# Patient Record
Sex: Male | Born: 1995 | Hispanic: Yes | Marital: Single | Smoking: Never smoker
Health system: Southern US, Community
[De-identification: ages and names within clinical notes are randomized; demographics above are authoritative.]

## PROBLEM LIST (undated history)

## (undated) DIAGNOSIS — E739 Lactose intolerance, unspecified: Secondary | ICD-10-CM

## (undated) DIAGNOSIS — R143 Flatulence: Secondary | ICD-10-CM

## (undated) DIAGNOSIS — S62101A Fracture of unspecified carpal bone, right wrist, initial encounter for closed fracture: Secondary | ICD-10-CM

## (undated) HISTORY — PX: APPENDECTOMY: SHX54

## (undated) HISTORY — DX: Lactose intolerance, unspecified: E73.9

## (undated) HISTORY — PX: COLONOSCOPY: SHX174

## (undated) HISTORY — DX: Flatulence: R14.3

---
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
## 2013-07-10 ENCOUNTER — Emergency Department (HOSPITAL_COMMUNITY)
Admission: EM | Admit: 2013-07-10 | Discharge: 2013-07-10 | Disposition: A | Discharge status: Home / Self Care | Payer: Medicaid Other | Attending: Emergency Medicine | Admitting: Emergency Medicine

## 2013-07-10 ENCOUNTER — Encounter (HOSPITAL_COMMUNITY): Payer: Self-pay | Admitting: Emergency Medicine

## 2013-07-10 DIAGNOSIS — R1012 Left upper quadrant pain: Secondary | ICD-10-CM | POA: Insufficient documentation

## 2013-07-10 DIAGNOSIS — A088 Other specified intestinal infections: Secondary | ICD-10-CM | POA: Insufficient documentation

## 2013-07-10 DIAGNOSIS — Z8719 Personal history of other diseases of the digestive system: Secondary | ICD-10-CM | POA: Insufficient documentation

## 2013-07-10 DIAGNOSIS — R197 Diarrhea, unspecified: Secondary | ICD-10-CM | POA: Insufficient documentation

## 2013-07-10 DIAGNOSIS — R142 Eructation: Secondary | ICD-10-CM | POA: Insufficient documentation

## 2013-07-10 DIAGNOSIS — A084 Viral intestinal infection, unspecified: Secondary | ICD-10-CM

## 2013-07-10 DIAGNOSIS — R141 Gas pain: Secondary | ICD-10-CM | POA: Insufficient documentation

## 2013-07-10 MED ORDER — ONDANSETRON 8 MG PO TBDP
8.0000 mg | ORAL_TABLET | Freq: Once | ORAL | Status: AC
  Administered 2013-07-10: 8 mg via ORAL
  Filled 2013-07-10: qty 1

## 2013-07-10 MED ORDER — ONDANSETRON HCL 8 MG PO TABS: 8.0000 mg | ORAL_TABLET | Freq: Three times a day (TID) | ORAL | Status: DC | PRN

## 2013-07-10 MED ORDER — ACETAMINOPHEN 325 MG PO TABS
650.0000 mg | ORAL_TABLET | Freq: Once | ORAL | Status: AC
  Administered 2013-07-10: 650 mg via ORAL
  Filled 2013-07-10: qty 2

## 2013-07-10 NOTE — ED Notes (Signed)
Pt tolerated ginger ale well---- able to drink 2 cans; denies abdominal pain and nausea at this time; pt states, "pain is gone".  Has had watery stool, was advised to drink plenty of fluids and eat bananas.

## 2013-07-10 NOTE — ED Notes (Signed)
Pt was given ginger ale per his preference.

## 2013-07-10 NOTE — ED Provider Notes (Signed)
Medical screening examination/treatment/procedure(s) were performed by non-physician practitioner and as supervising physician I was immediately available for consultation/collaboration.    Olivia Mackie, MD 07/10/13 [phone]

## 2013-07-10 NOTE — ED Provider Notes (Signed)
CSN: [id number]     Arrival date & time 07/10/13  0301 History   First MD Initiated Contact with Patient 07/10/13 0310     Chief Complaint  Patient presents with  . Emesis  . Diarrhea  . Abdominal Pain   (Consider location/radiation/quality/duration/timing/severity/associated sxs/prior Treatment) HPI History provided by pt.   Pt presents w/ N/V/D since yesterday am.  Associated w/ diffuse abdominal cramping and gurgling that is temporarily alleviated by having a BM, as well as belching that smells of feces.  Denies fever, hematemesis/hematochezia/melena and urinary sx.  No known sick contacts.  No PMH.   Past Medical History  Diagnosis Date  . Flatulence   . Lactose malabsorption   . Wrist fracture, right    Past Surgical History  Procedure Laterality Date  . Appendectomy    . Colonoscopy     Family History  Problem Relation Age of Onset  . Diabetes Other    History  Substance Use Topics  . Smoking status: Never Smoker   . Smokeless tobacco: Not on file  . Alcohol Use: No    Review of Systems  All other systems reviewed and are negative.    Allergies  Review of patient's allergies indicates no known allergies.  Home Medications  No current outpatient prescriptions on file. BP 121/61  Pulse 64  Temp(Src) 98.4 F (36.9 C) (Oral)  Resp 19  Ht 5\' 7"  (1.702 m)  Wt 134 lb 9.6 oz (61.054 kg)  BMI 21.08 kg/m2  SpO2 100% Physical Exam  Nursing note and vitals reviewed. Constitutional: He is oriented to person, place, and time. He appears well-developed and well-nourished. No distress.  HENT:  Head: Normocephalic and atraumatic.  Mouth/Throat: Oropharynx is clear and moist.  Eyes:  Normal appearance  Neck: Normal range of motion.  Cardiovascular: Normal rate and regular rhythm.   HR 60  Pulmonary/Chest: Effort normal and breath sounds normal. No respiratory distress.  Abdominal: Soft. Bowel sounds are normal. He exhibits no distension and no mass. There is no  rebound and no guarding.  Mild tenderness LUQ only  Genitourinary:  No CVA tenderness  Musculoskeletal: Normal range of motion.  Neurological: He is alert and oriented to person, place, and time.  Skin: Skin is warm and dry. No rash noted.  Psychiatric: He has a normal mood and affect. His behavior is normal.    ED Course  Procedures (including critical care time) Labs Review Labs Reviewed - No data to display Imaging Review No results found.  EKG Interpretation   None       MDM   1. Viral gastroenteritis    17yo healthy M presents w/ N/V/D and diffuse abd pain.  On exam, afebrile, well-hydrated, abd benign.  Suspect viral gastroenteritis.  Will try oral zofran and tylenol and then po challenge.  3:46 AM   Pain and nausea resolved.  Tolerating pos.  D/c'd home w/ zofran and recommended imodium prn and oral hydration.  Return precautions discussed. 4:43 AM     Otilio Miu, PA-C 07/10/13 [phone]

## 2013-07-10 NOTE — ED Notes (Signed)
Pt states he started having abd pain on Tuesday morning that was followed by diarrhea  Pt states he has had a lot of gas and when he burps it smells of feces  Pt states vomiting started this evening  Last vomited about 30 minutes ago

## 2014-10-20 ENCOUNTER — Encounter (HOSPITAL_COMMUNITY): Payer: Self-pay

## 2014-10-20 ENCOUNTER — Emergency Department (HOSPITAL_COMMUNITY)
Admission: EM | Admit: 2014-10-20 | Discharge: 2014-10-20 | Disposition: A | Discharge status: Home / Self Care | Payer: Medicaid Other | Attending: Emergency Medicine | Admitting: Emergency Medicine

## 2014-10-20 DIAGNOSIS — Z8781 Personal history of (healed) traumatic fracture: Secondary | ICD-10-CM | POA: Insufficient documentation

## 2014-10-20 DIAGNOSIS — R51 Headache: Secondary | ICD-10-CM | POA: Insufficient documentation

## 2014-10-20 DIAGNOSIS — R519 Headache, unspecified: Secondary | ICD-10-CM

## 2014-10-20 DIAGNOSIS — Z8639 Personal history of other endocrine, nutritional and metabolic disease: Secondary | ICD-10-CM | POA: Insufficient documentation

## 2014-10-20 DIAGNOSIS — H5712 Ocular pain, left eye: Secondary | ICD-10-CM | POA: Insufficient documentation

## 2014-10-20 MED ORDER — METOCLOPRAMIDE HCL 10 MG PO TABS: 10.0000 mg | ORAL_TABLET | Freq: Four times a day (QID) | ORAL | Status: DC

## 2014-10-20 MED ORDER — METOCLOPRAMIDE HCL 5 MG/ML IJ SOLN
10.0000 mg | Freq: Once | INTRAMUSCULAR | Status: AC
  Administered 2014-10-20: 10 mg via INTRAMUSCULAR
  Filled 2014-10-20: qty 2

## 2014-10-20 MED ORDER — KETOROLAC TROMETHAMINE 60 MG/2ML IM SOLN
60.0000 mg | Freq: Once | INTRAMUSCULAR | Status: AC
  Administered 2014-10-20: 60 mg via INTRAMUSCULAR
  Filled 2014-10-20: qty 2

## 2014-10-20 MED ORDER — NAPROXEN 500 MG PO TABS: 500.0000 mg | ORAL_TABLET | Freq: Two times a day (BID) | ORAL | Status: DC

## 2014-10-20 NOTE — Discharge Instructions (Signed)
Headaches, Frequently Asked Questions °MIGRAINE HEADACHES °Q: What is migraine? What causes it? How can I treat it? °A: Generally, migraine headaches begin as a dull ache. Then they develop into a constant, throbbing, and pulsating pain. You may experience pain at the temples. You may experience pain at the front or back of one or both sides of the head. The pain is usually accompanied by a combination of: °· Nausea. °· Vomiting. °· Sensitivity to light and noise. °Some people (about 15%) experience an aura (see below) before an attack. The cause of migraine is believed to be chemical reactions in the brain. Treatment for migraine may include over-the-counter or prescription medications. It may also include self-help techniques. These include relaxation training and biofeedback.  °Q: What is an aura? °A: About 15% of people with migraine get an "aura". This is a sign of neurological symptoms that occur before a migraine headache. You may see wavy or jagged lines, dots, or flashing lights. You might experience tunnel vision or blind spots in one or both eyes. The aura can include visual or auditory hallucinations (something imagined). It may include disruptions in smell (such as strange odors), taste or touch. Other symptoms include: °· Numbness. °· A "pins and needles" sensation. °· Difficulty in recalling or speaking the correct word. °These neurological events may last as long as 60 minutes. These symptoms will fade as the headache begins. °Q: What is a trigger? °A: Certain physical or environmental factors can lead to or "trigger" a migraine. These include: °· Foods. °· Hormonal changes. °· Weather. °· Stress. °It is important to remember that triggers are different for everyone. To help prevent migraine attacks, you need to figure out which triggers affect you. Keep a headache diary. This is a good way to track triggers. The diary will help you talk to your healthcare professional about your condition. °Q: Does  weather affect migraines? °A: Bright sunshine, hot, humid conditions, and drastic changes in barometric pressure may lead to, or "trigger," a migraine attack in some people. But studies have shown that weather does not act as a trigger for everyone with migraines. °Q: What is the link between migraine and hormones? °A: Hormones start and regulate many of your body's functions. Hormones keep your body in balance within a constantly changing environment. The levels of hormones in your body are unbalanced at times. Examples are during menstruation, pregnancy, or menopause. That can lead to a migraine attack. In fact, about three quarters of all women with migraine report that their attacks are related to the menstrual cycle.  °Q: Is there an increased risk of stroke for migraine sufferers? °A: The likelihood of a migraine attack causing a stroke is very remote. That is not to say that migraine sufferers cannot have a stroke associated with their migraines. In persons under age 40, the most common associated factor for stroke is migraine headache. But over the course of a person's normal life span, the occurrence of migraine headache may actually be associated with a reduced risk of dying from cerebrovascular disease due to stroke.  °Q: What are acute medications for migraine? °A: Acute medications are used to treat the pain of the headache after it has started. Examples over-the-counter medications, NSAIDs, ergots, and triptans.  °Q: What are the triptans? °A: Triptans are the newest class of abortive medications. They are specifically targeted to treat migraine. Triptans are vasoconstrictors. They moderate some chemical reactions in the brain. The triptans work on receptors in your brain. Triptans help   to restore the balance of a neurotransmitter called serotonin. Fluctuations in levels of serotonin are thought to be a main cause of migraine.  °Q: Are over-the-counter medications for migraine effective? °A:  Over-the-counter, or "OTC," medications may be effective in relieving mild to moderate pain and associated symptoms of migraine. But you should see your caregiver before beginning any treatment regimen for migraine.  °Q: What are preventive medications for migraine? °A: Preventive medications for migraine are sometimes referred to as "prophylactic" treatments. They are used to reduce the frequency, severity, and length of migraine attacks. Examples of preventive medications include antiepileptic medications, antidepressants, beta-blockers, calcium channel blockers, and NSAIDs (nonsteroidal anti-inflammatory drugs). °Q: Why are anticonvulsants used to treat migraine? °A: During the past few years, there has been an increased interest in antiepileptic drugs for the prevention of migraine. They are sometimes referred to as "anticonvulsants". Both epilepsy and migraine may be caused by similar reactions in the brain.  °Q: Why are antidepressants used to treat migraine? °A: Antidepressants are typically used to treat people with depression. They may reduce migraine frequency by regulating chemical levels, such as serotonin, in the brain.  °Q: What alternative therapies are used to treat migraine? °A: The term "alternative therapies" is often used to describe treatments considered outside the scope of conventional Western medicine. Examples of alternative therapy include acupuncture, acupressure, and yoga. Another common alternative treatment is herbal therapy. Some herbs are believed to relieve headache pain. Always discuss alternative therapies with your caregiver before proceeding. Some herbal products contain arsenic and other toxins. °TENSION HEADACHES °Q: What is a tension-type headache? What causes it? How can I treat it? °A: Tension-type headaches occur randomly. They are often the result of temporary stress, anxiety, fatigue, or anger. Symptoms include soreness in your temples, a tightening band-like sensation  around your head (a "vice-like" ache). Symptoms can also include a pulling feeling, pressure sensations, and contracting head and neck muscles. The headache begins in your forehead, temples, or the back of your head and neck. Treatment for tension-type headache may include over-the-counter or prescription medications. Treatment may also include self-help techniques such as relaxation training and biofeedback. °CLUSTER HEADACHES °Q: What is a cluster headache? What causes it? How can I treat it? °A: Cluster headache gets its name because the attacks come in groups. The pain arrives with little, if any, warning. It is usually on one side of the head. A tearing or bloodshot eye and a runny nose on the same side of the headache may also accompany the pain. Cluster headaches are believed to be caused by chemical reactions in the brain. They have been described as the most severe and intense of any headache type. Treatment for cluster headache includes prescription medication and oxygen. °SINUS HEADACHES °Q: What is a sinus headache? What causes it? How can I treat it? °A: When a cavity in the bones of the face and skull (a sinus) becomes inflamed, the inflammation will cause localized pain. This condition is usually the result of an allergic reaction, a tumor, or an infection. If your headache is caused by a sinus blockage, such as an infection, you will probably have a fever. An x-ray will confirm a sinus blockage. Your caregiver's treatment might include antibiotics for the infection, as well as antihistamines or decongestants.  °REBOUND HEADACHES °Q: What is a rebound headache? What causes it? How can I treat it? °A: A pattern of taking acute headache medications too often can lead to a condition known as "rebound headache."   A pattern of taking too much headache medication includes taking it more than 2 days per week or in excessive amounts. That means more than the label or a caregiver advises. With rebound  headaches, your medications not only stop relieving pain, they actually begin to cause headaches. Doctors treat rebound headache by tapering the medication that is being overused. Sometimes your caregiver will gradually substitute a different type of treatment or medication. Stopping may be a challenge. Regularly overusing a medication increases the potential for serious side effects. Consult a caregiver if you regularly use headache medications more than 2 days per week or more than the label advises. °ADDITIONAL QUESTIONS AND ANSWERS °Q: What is biofeedback? °A: Biofeedback is a self-help treatment. Biofeedback uses special equipment to monitor your body's involuntary physical responses. Biofeedback monitors: °· Breathing. °· Pulse. °· Heart rate. °· Temperature. °· Muscle tension. °· Brain activity. °Biofeedback helps you refine and perfect your relaxation exercises. You learn to control the physical responses that are related to stress. Once the technique has been mastered, you do not need the equipment any more. °Q: Are headaches hereditary? °A: Four out of five (80%) of people that suffer report a family history of migraine. Scientists are not sure if this is genetic or a family predisposition. Despite the uncertainty, a child has a 50% chance of having migraine if one parent suffers. The child has a 75% chance if both parents suffer.  °Q: Can children get headaches? °A: By the time they reach high school, most young people have experienced some type of headache. Many safe and effective approaches or medications can prevent a headache from occurring or stop it after it has begun.  °Q: What type of doctor should I see to diagnose and treat my headache? °A: Start with your primary caregiver. Discuss his or her experience and approach to headaches. Discuss methods of classification, diagnosis, and treatment. Your caregiver may decide to recommend you to a headache specialist, depending upon your symptoms or other  physical conditions. Having diabetes, allergies, etc., may require a more comprehensive and inclusive approach to your headache. The [employer] will provide, upon request, a list of NHF physician members in your state. °Document Released: 10/01/2003 Document Revised: 10/03/2011 Document Reviewed: 03/10/2008 °ExitCare® Patient Information ©2015 [employer]. This information is not intended to replace advice given to you by your health care provider. Make sure you discuss any questions you have with your health care provider. ° °Migraine Headache °A migraine headache is an intense, throbbing pain on one or both sides of your head. A migraine can last for 30 minutes to several hours. °CAUSES  °The exact cause of a migraine headache is not always known. However, a migraine may be caused when nerves in the brain become irritated and release chemicals that cause inflammation. This causes pain. °Certain things may also trigger migraines, such as: °· Alcohol. °· Smoking. °· Stress. °· Menstruation. °· Aged cheeses. °· Foods or drinks that contain nitrates, glutamate, aspartame, or tyramine. °· Lack of sleep. °· Chocolate. °· Caffeine. °· Hunger. °· Physical exertion. °· Fatigue. °· Medicines used to treat chest pain (nitroglycerine), birth control pills, estrogen, and some blood pressure medicines. °SIGNS AND SYMPTOMS °· Pain on one or both sides of your head. °· Pulsating or throbbing pain. °· Severe pain that prevents daily activities. °· Pain that is aggravated by any physical activity. °· Nausea, vomiting, or both. °· Dizziness. °· Pain with exposure to bright lights, loud noises, or activity. °·   General sensitivity to bright lights, loud noises, or smells. °Before you get a migraine, you may get warning signs that a migraine is coming (aura). An aura may include: °· Seeing flashing lights. °· Seeing bright spots, halos, or zigzag lines. °· Having tunnel vision or blurred vision. °· Having feelings  of numbness or tingling. °· Having trouble talking. °· Having muscle weakness. °DIAGNOSIS  °A migraine headache is often diagnosed based on: °· Symptoms. °· Physical exam. °· A CT scan or MRI of your head. These imaging tests cannot diagnose migraines, but they can help rule out other causes of headaches. °TREATMENT °Medicines may be given for pain and nausea. Medicines can also be given to help prevent recurrent migraines.  °HOME CARE INSTRUCTIONS °· Only take over-the-counter or prescription medicines for pain or discomfort as directed by your health care provider. The use of long-term narcotics is not recommended. °· Lie down in a dark, quiet room when you have a migraine. °· Keep a journal to find out what may trigger your migraine headaches. For example, write down: °¨ What you eat and drink. °¨ How much sleep you get. °¨ Any change to your diet or medicines. °· Limit alcohol consumption. °· Quit smoking if you smoke. °· Get 7-9 hours of sleep, or as recommended by your health care provider. °· Limit stress. °· Keep lights dim if bright lights bother you and make your migraines worse. °SEEK IMMEDIATE MEDICAL CARE IF:  °· Your migraine becomes severe. °· You have a fever. °· You have a stiff neck. °· You have vision loss. °· You have muscular weakness or loss of muscle control. °· You start losing your balance or have trouble walking. °· You feel faint or pass out. °· You have severe symptoms that are different from your first symptoms. °MAKE SURE YOU:  °· Understand these instructions. °· Will watch your condition. °· Will get help right away if you are not doing well or get worse. °Document Released: 07/11/2005 Document Revised: 11/25/2013 Document Reviewed: 03/18/2013 °ExitCare® Patient Information ©2015 [employer]. This information is not intended to replace advice given to you by your health care provider. Make sure you discuss any questions you have with your health care provider. ° °

## 2014-10-20 NOTE — ED Notes (Addendum)
Pt states he has had left eye pain and headache x 4 days. Denies injury. No redness or exudate noted. Denies n/v. Pt has mild photophobia.

## 2014-10-20 NOTE — ED Provider Notes (Signed)
CSN: [id number]     Arrival date & time 10/20/14  1329 History  This chart was scribed for non-physician practitioner, Joycie Peek, PA-C, working with Zadie Rhine, MD by Charline Bills, ED Scribe. This patient was seen in room TR04C/TR04C and the patient's care was started at 2:52 PM.   Chief Complaint  Patient presents with  . Headache  . Eye Pain   The history is provided by the patient. No language interpreter was used.   HPI Comments: Kevin Anderson is a 19 y.o. male who presents to the Emergency Department complaining of gradual onset, persistent L-sided HA for the past 4 days. Pt describes HA as 7/10, pulsating sensation and pressure. He reports associated intermittent throbbing L eye pain for the past 4 days. Pain is improved with leaning forward and exacerbated with standing and swift movement. He reports h/o similar HA . Denies numbness, weakness, nausea, vomiting, difficulty walking, neck stiffness, fevers, rash. Pt has been treating with ibuprofen some relief. No other alleviating or aggravating factors.   Past Medical History  Diagnosis Date  . Flatulence   . Lactose malabsorption   . Wrist fracture, right    Past Surgical History  Procedure Laterality Date  . Appendectomy    . Colonoscopy     Family History  Problem Relation Age of Onset  . Diabetes Other    History  Substance Use Topics  . Smoking status: Never Smoker   . Smokeless tobacco: Not on file  . Alcohol Use: No    Review of Systems  Eyes: Positive for pain.  Gastrointestinal: Negative for nausea and vomiting.  Musculoskeletal: Negative for gait problem.  Neurological: Positive for headaches. Negative for weakness and numbness.   Allergies  Review of patient's allergies indicates no known allergies.  Home Medications   Prior to Admission medications   Medication Sig Start Date End Date Taking? Authorizing Provider  metoCLOPramide (REGLAN) 10 MG tablet Take 1 tablet (10 mg total) by  mouth every 6 (six) hours. 10/20/14   Joycie Peek, PA-C  naproxen (NAPROSYN) 500 MG tablet Take 1 tablet (500 mg total) by mouth 2 (two) times daily. 10/20/14   Joycie Peek, PA-C  ondansetron (ZOFRAN) 8 MG tablet Take 1 tablet (8 mg total) by mouth every 8 (eight) hours as needed for nausea or vomiting. 07/10/13   Ruby Cola, PA-C   BP 141/75 mmHg  Pulse 74  Temp(Src) 98 F (36.7 C) (Oral)  Resp 17  Ht  (1.702 m)  Wt 141 lb (63.957 kg)  BMI 22.08 kg/m2  SpO2 100% Physical Exam  Constitutional: He is oriented to person, place, and time. He appears well-developed and well-nourished. No distress.  HENT:  Head: Normocephalic and atraumatic.  Eyes: Conjunctivae and EOM are normal. Pupils are equal, round, and reactive to light.  Extraocular movements intact without discomfort or nystagmus.  Neck: Neck supple. No tracheal deviation present.  No meningismus or nuchal rigidity  Cardiovascular: Normal rate, regular rhythm and normal heart sounds.   Pulmonary/Chest: Effort normal. No respiratory distress.  Lungs are clear to auscultation.   Abdominal: Soft. There is no tenderness.  Musculoskeletal: Normal range of motion.  Moves all extremities without ataxia.  Neurological: He is alert and oriented to person, place, and time. He has normal strength. Gait normal.  Motor and strength is 5/5. Gait is normal.  Skin: Skin is warm and dry.  Psychiatric: He has a normal mood and affect. His behavior is normal.  Nursing note and vitals reviewed.  ED Course  Procedures (including critical care time) DIAGNOSTIC STUDIES: Oxygen Saturation is 100% on RA, normal by my interpretation.    COORDINATION OF CARE: 2:59 PM-Discussed treatment plan which includes Toradol and Reglan with pt at bedside and pt agreed to plan.   Labs Review Labs Reviewed - No data to display  Imaging Review No results found.   EKG Interpretation None     Meds given in ED:  Medications   ketorolac (TORADOL) injection 60 mg (60 mg Intramuscular Given 10/20/14 1507)  metoCLOPramide (REGLAN) injection 10 mg (10 mg Intramuscular Given 10/20/14 1506)    New Prescriptions   METOCLOPRAMIDE (REGLAN) 10 MG TABLET    Take 1 tablet (10 mg total) by mouth every 6 (six) hours.   NAPROXEN (NAPROSYN) 500 MG TABLET    Take 1 tablet (500 mg total) by mouth 2 (two) times daily.   Filed Vitals:   10/20/14 1333 10/20/14 1335 10/20/14 1435  BP: 135/70  141/75  Pulse: 74  74  Temp: 97.8 F (36.6 C)  98 F (36.7 C)  TempSrc:   Oral  Resp: 18  17  Height:  5\' 7"  (1.702 m)   Weight:  141 lb (63.957 kg)   SpO2: 100%  100%    MDM  Vitals stable - WNL -afebrile Pt resting comfortably in ED.  reports complete resolution of symptoms after administration of medications in ED. PE--normal neuro exam. No meningeal signs. No evidence of other acute or emergent pathology.  DDX--patient here for evaluation of intermittent, gradual onset headache similar to headaches he has had in the past. Symptoms completely resolved with administration of Toradol and Reglan. Low concern for dural venous thrombosis, meningitis, CVA/SAH/ICH. Will DC with naproxen and Reglan for any discomfort/headache in the future.  I discussed all relevant lab findings and imaging results with pt and they verbalized understanding. Discussed f/u with PCP within 48 hrs and return precautions, pt very amenable to plan. Patient stable, in good condition and ambulatory out of the ED without difficulty.  Final diagnoses:  Acute nonintractable headache, unspecified headache type   I personally performed the services described in this documentation, which was scribed in my presence. The recorded information has been reviewed and is accurate.    Joycie PeekBenjamin Yassin Scales, PA-C 10/20/14 1623  Zadie Rhineonald Wickline, MD 10/22/14 [phone]

## 2014-10-20 NOTE — ED Notes (Signed)
Pt reports 4 day hx of headache and eye pain, sts had to leave work today, denies vision issues, but has some photosensitivity

## 2014-10-20 NOTE — ED Notes (Signed)
Declined W/C at D/C and was escorted to lobby by RN. 

## 2014-10-31 ENCOUNTER — Ambulatory Visit: Payer: Medicaid Other

## 2014-12-28 ENCOUNTER — Emergency Department (HOSPITAL_COMMUNITY)
Admission: EM | Admit: 2014-12-28 | Discharge: 2014-12-28 | Disposition: A | Discharge status: Home / Self Care | Payer: Medicaid Other | Attending: Emergency Medicine | Admitting: Emergency Medicine

## 2014-12-28 ENCOUNTER — Encounter (HOSPITAL_COMMUNITY): Payer: Self-pay | Admitting: Emergency Medicine

## 2014-12-28 DIAGNOSIS — R51 Headache: Secondary | ICD-10-CM | POA: Insufficient documentation

## 2014-12-28 DIAGNOSIS — R519 Headache, unspecified: Secondary | ICD-10-CM

## 2014-12-28 DIAGNOSIS — Z8639 Personal history of other endocrine, nutritional and metabolic disease: Secondary | ICD-10-CM | POA: Insufficient documentation

## 2014-12-28 DIAGNOSIS — Z8781 Personal history of (healed) traumatic fracture: Secondary | ICD-10-CM | POA: Insufficient documentation

## 2014-12-28 DIAGNOSIS — Z791 Long term (current) use of non-steroidal anti-inflammatories (NSAID): Secondary | ICD-10-CM | POA: Insufficient documentation

## 2014-12-28 MED ORDER — KETOROLAC TROMETHAMINE 60 MG/2ML IM SOLN
60.0000 mg | Freq: Once | INTRAMUSCULAR | Status: AC
  Administered 2014-12-28: 60 mg via INTRAMUSCULAR
  Filled 2014-12-28: qty 2

## 2014-12-28 NOTE — ED Notes (Signed)
Pts headache is totally gone

## 2014-12-28 NOTE — ED Notes (Signed)
Patient states severe headache starting yesterday. Reduced to "regular headache". Patient attempted to sleep and pain became worse after laying down. Took ibuprofen about 1 hr ago with some relief.

## 2014-12-28 NOTE — Discharge Instructions (Signed)
Please read and follow all provided instructions.  Your diagnoses today include:  1. Acute nonintractable headache, unspecified headache type     Tests performed today include:  Vital signs. See below for your results today.   Medications:  In the Emergency Department you received:  Toradol - NSAID medication similar to ibuprofen  Take any prescribed medications only as directed.  Additional information:  Follow any educational materials contained in this packet.  You are having a headache. No specific cause was found today for your headache. It may have been a migraine or other cause of headache. Stress, anxiety, fatigue, and depression are common triggers for headaches.   Your headache today does not appear to be life-threatening or require hospitalization, but often the exact cause of headaches is not determined in the emergency department. Therefore, follow-up with your doctor is very important to find out what may have caused your headache and whether or not you need any further diagnostic testing or treatment.   Sometimes headaches can appear benign (not harmful), but then more serious symptoms can develop which should prompt an immediate re-evaluation by your doctor or the emergency department.  BE VERY CAREFUL not to take multiple medicines containing Tylenol (also called acetaminophen). Doing so can lead to an overdose which can damage your liver and cause liver failure and possibly death.   Follow-up instructions: Please follow-up with your primary care provider in the next 3 days for further evaluation of your symptoms.   Return instructions:   Please return to the Emergency Department if you experience worsening symptoms.  Return if the medications do not resolve your headache, if it recurs, or if you have multiple episodes of vomiting or cannot keep down fluids.  Return if you have a change from the usual headache.  RETURN IMMEDIATELY IF you:  Develop a sudden,  severe headache  Develop confusion or become poorly responsive or faint  Develop a fever above 100.28F or problem breathing  Have a change in speech, vision, swallowing, or understanding  Develop new weakness, numbness, tingling, incoordination in your arms or legs  Have a seizure  Please return if you have any other emergent concerns.  Additional Information:  Your vital signs today were: BP 111/64 mmHg   Pulse 61   Temp(Src) 97.8 F (36.6 C) (Oral)   Resp 16   SpO2 100% If your blood pressure (BP) was elevated above 135/85 this visit, please have this repeated by your doctor within one month. --------------

## 2014-12-28 NOTE — ED Notes (Signed)
The pt  Has had a headache since 1200 mn  He has a history of migraine headaches.  No n v or diarrhea

## 2014-12-28 NOTE — ED Provider Notes (Signed)
CSN: [id number]     Arrival date & time 12/28/14  0518 History   First MD Initiated Contact with Patient 12/28/14 [phone]     Chief Complaint  Patient presents with  . Headache     (Consider location/radiation/quality/duration/timing/severity/associated sxs/prior Treatment) HPI Comments: Patient presents with complaint of headache which started approximately midnight today. Patient describes migrating pain on both sides of his head. Pain is worse at the area of contact when he lays his head down on a pillow. He also feels pressure behind his eyes. He denies head injury, fever, neck pain. Pain is not worsened or changed with movement of his neck. He does not have significant photophobia or phonophobia. Patient states that he had headaches as a kid but does not get regular headaches. He was seen in emergency department in March 2016 with a similar, but more prolonged headache. At that time he was treated for a migraine headache with Reglan and Toradol which resolved his headache. Patient took ibuprofen this morning without relief. Patient denies signs of stroke including: facial droop, slurred speech, aphasia, weakness/numbness in extremities, imbalance/trouble walking.   Patient is a 19 y.o. male presenting with headaches. The history is provided by the patient and medical records.  Headache Associated symptoms: no congestion, no fever, no nausea, no neck pain, no neck stiffness, no numbness, no photophobia, no sinus pressure, no vomiting and no weakness     Past Medical History  Diagnosis Date  . Flatulence   . Lactose malabsorption   . Wrist fracture, right    Past Surgical History  Procedure Laterality Date  . Appendectomy    . Colonoscopy     Family History  Problem Relation Age of Onset  . Diabetes Other    History  Substance Use Topics  . Smoking status: Never Smoker   . Smokeless tobacco: Not on file  . Alcohol Use: No    Review of Systems  Constitutional: Negative for  fever.  HENT: Negative for congestion, dental problem, rhinorrhea and sinus pressure.   Eyes: Negative for photophobia, discharge, redness and visual disturbance.  Respiratory: Negative for shortness of breath.   Cardiovascular: Negative for chest pain.  Gastrointestinal: Negative for nausea and vomiting.  Musculoskeletal: Negative for gait problem, neck pain and neck stiffness.  Skin: Negative for rash.  Neurological: Positive for headaches. Negative for syncope, speech difficulty, weakness, light-headedness and numbness.  Psychiatric/Behavioral: Negative for confusion.      Allergies  Review of patient's allergies indicates no known allergies.  Home Medications   Prior to Admission medications   Medication Sig Start Date End Date Taking? Authorizing Provider  metoCLOPramide (REGLAN) 10 MG tablet Take 1 tablet (10 mg total) by mouth every 6 (six) hours. 10/20/14   Joycie Peek, PA-C  naproxen (NAPROSYN) 500 MG tablet Take 1 tablet (500 mg total) by mouth 2 (two) times daily. 10/20/14   Joycie Peek, PA-C  ondansetron (ZOFRAN) 8 MG tablet Take 1 tablet (8 mg total) by mouth every 8 (eight) hours as needed for nausea or vomiting. 07/10/13   Ruby Cola, PA-C   BP 125/68 mmHg  Pulse 74  Temp(Src) 97.8 F (36.6 C) (Oral)  Resp 16  SpO2 100% Physical Exam  Constitutional: He is oriented to person, place, and time. He appears well-developed and well-nourished.  HENT:  Head: Normocephalic and atraumatic.  Right Ear: Tympanic membrane, external ear and ear canal normal.  Left Ear: Tympanic membrane, external ear and ear canal normal.  Nose: Nose normal.  Mouth/Throat:  Uvula is midline, oropharynx is clear and moist and mucous membranes are normal.  Eyes: Conjunctivae, EOM and lids are normal. Pupils are equal, round, and reactive to light.  Neck: Normal range of motion. Neck supple.  No meningismus. Full range of motion of neck in all 6 directions without any  difficulty.  Cardiovascular: Normal rate and regular rhythm.   Pulmonary/Chest: Effort normal and breath sounds normal.  Abdominal: Soft. There is no tenderness.  Musculoskeletal: Normal range of motion.       Cervical back: He exhibits normal range of motion, no tenderness and no bony tenderness.  Neurological: He is alert and oriented to person, place, and time. He has normal strength and normal reflexes. No cranial nerve deficit or sensory deficit. He exhibits normal muscle tone. He displays a negative Romberg sign. Coordination and gait normal. GCS eye subscore is 4. GCS verbal subscore is 5. GCS motor subscore is 6.  Skin: Skin is warm and dry.  Psychiatric: He has a normal mood and affect.  Nursing note and vitals reviewed.   ED Course  Procedures (including critical care time) Labs Review Labs Reviewed - No data to display  Imaging Review No results found.   EKG Interpretation None       6:31 AM Patient seen and examined. Medications ordered. Normal neurological exam. Do not feel there is any indication for labs her head CT at this time. Patient does not have any clinical signs of meningitis. No reported trauma. Will give IM Toradol only as patient is driving home.  Vital signs reviewed and are as follows: BP 125/68 mmHg  Pulse 74  Temp(Src) 97.8 F (36.6 C) (Oral)  Resp 16  SpO2 100%  7:31 AM HA resolved with toradol. Pt requesting discharge to home. Will discharge.  Patient encouraged to return with worsening severe headache, fever, vomiting, weakness in arms/legs, other concerns. Patient verbalizes understanding and agrees with plan.    MDM   Final diagnoses:  Acute nonintractable headache, unspecified headache type   Patient without high-risk features of headache including: sudden onset/thunderclap HA, no similar headache in past, altered mental status, accompanying seizure, headache with exertion, age > 4850, history of immunocompromise, neck or shoulder pain,  fever, use of anticoagulation, family history of spontaneous SAH, concomitant drug use, toxic exposure.   Patient has a normal complete neurological exam, normal vital signs, normal level of consciousness, no signs of meningismus, is well-appearing/non-toxic appearing, no signs of trauma.   Imaging with CT/MRI not indicated given history and physical exam findings.   No dangerous or life-threatening conditions suspected or identified by history, physical exam, and by work-up. No indications for hospitalization identified.      Renne CriglerJoshua Zaneta Lightcap, PA-C 12/28/14 [phone]  Zadie Rhineonald Wickline, MD 12/28/14 [phone]

## 2014-12-28 NOTE — ED Notes (Signed)
Pt placed in a gown and hooked up to the monitor with the BP cuff and pulse ox 

## 2015-05-14 ENCOUNTER — Emergency Department (HOSPITAL_COMMUNITY)
Admission: EM | Admit: 2015-05-14 | Discharge: 2015-05-14 | Disposition: A | Discharge status: Home / Self Care | Payer: Medicaid Other | Attending: Physician Assistant | Admitting: Physician Assistant

## 2015-05-14 ENCOUNTER — Encounter (HOSPITAL_COMMUNITY): Payer: Self-pay | Admitting: *Deleted

## 2015-05-14 DIAGNOSIS — Z8781 Personal history of (healed) traumatic fracture: Secondary | ICD-10-CM | POA: Insufficient documentation

## 2015-05-14 DIAGNOSIS — Z791 Long term (current) use of non-steroidal anti-inflammatories (NSAID): Secondary | ICD-10-CM | POA: Insufficient documentation

## 2015-05-14 DIAGNOSIS — Z79899 Other long term (current) drug therapy: Secondary | ICD-10-CM | POA: Insufficient documentation

## 2015-05-14 DIAGNOSIS — M25512 Pain in left shoulder: Secondary | ICD-10-CM | POA: Insufficient documentation

## 2015-05-14 MED ORDER — CYCLOBENZAPRINE HCL 10 MG PO TABS
10.0000 mg | ORAL_TABLET | Freq: Once | ORAL | Status: DC
  Filled 2015-05-14: qty 1

## 2015-05-14 MED ORDER — IBUPROFEN 400 MG PO TABS
800.0000 mg | ORAL_TABLET | Freq: Once | ORAL | Status: AC
  Administered 2015-05-14: 800 mg via ORAL
  Filled 2015-05-14: qty 2

## 2015-05-14 MED ORDER — MELOXICAM 15 MG PO TABS: 15.0000 mg | ORAL_TABLET | Freq: Every day | ORAL | Status: DC

## 2015-05-14 NOTE — Discharge Instructions (Signed)

## 2015-05-14 NOTE — ED Notes (Signed)
PT reports he woke up with shoulder pain this morning. No reported injury to shoulder.

## 2015-05-14 NOTE — ED Notes (Signed)
PT refused ice pack to Lt shoulder.

## 2015-05-14 NOTE — ED Notes (Signed)
Declined W/C at D/C and was escorted to lobby by RN. 

## 2015-05-14 NOTE — ED Provider Notes (Signed)
CSN: [id number]     Arrival date & time 05/14/15  [id number] History  By signing my name below, I, Jarvis Morganaylor Ferguson, attest that this documentation has been prepared under the direction and in the presence of Marlon Peliffany Taqwa Deem, PA-C Electronically Signed: Jarvis Morganaylor Ferguson, ED Scribe. 05/14/2015. 9:35 AM.    Chief Complaint  Patient presents with  . Shoulder Pain    The history is provided by the patient. No language interpreter was used.    HPI Comments: Kevin Anderson is a 19 y.o. male who presents to the Emergency Department complaining of sudden onset, constant, moderate, deep and aching, left shoulder pain that occurred around 8 hours ago. Pt states the pain began in his sleep. He has not had any meds PTA. He reports rotating his shoulder exacerbates the pain. He denies any known injury on trauma to the shoulder. Pt denies any issues with his shoulder in the past. He denies any recent strenuous activity. Pt is right hand dominant. He denies any numbness, weakness, bruising or swelling to the area. He does not have a primary care doctor.   Past Medical History  Diagnosis Date  . Flatulence   . Lactose malabsorption   . Wrist fracture, right    Past Surgical History  Procedure Laterality Date  . Appendectomy    . Colonoscopy     Family History  Problem Relation Age of Onset  . Diabetes Other    Social History  Substance Use Topics  . Smoking status: Never Smoker   . Smokeless tobacco: None  . Alcohol Use: No    Review of Systems  Musculoskeletal: Positive for myalgias and arthralgias. Negative for joint swelling.  Skin: Negative for color change.  Neurological: Negative for weakness and numbness.     Allergies  Review of patient's allergies indicates no known allergies.  Home Medications   Prior to Admission medications   Medication Sig Start Date End Date Taking? Authorizing Provider  meloxicam (MOBIC) 15 MG tablet Take 1 tablet (15 mg total) by mouth daily. 05/14/15    Margret Moat Neva SeatGreene, PA-C  metoCLOPramide (REGLAN) 10 MG tablet Take 1 tablet (10 mg total) by mouth every 6 (six) hours. 10/20/14   Joycie PeekBenjamin Cartner, PA-C  naproxen (NAPROSYN) 500 MG tablet Take 1 tablet (500 mg total) by mouth 2 (two) times daily. 10/20/14   Joycie PeekBenjamin Cartner, PA-C  ondansetron (ZOFRAN) 8 MG tablet Take 1 tablet (8 mg total) by mouth every 8 (eight) hours as needed for nausea or vomiting. 07/10/13   Ruby Colaatherine Schinlever, PA-C   Triage Vitals: BP 144/83 mmHg  Pulse 72  Temp(Src) 98.1 F (36.7 C) (Oral)  Resp 12  Ht 5\' 7"  (1.702 m)  Wt 140 lb (63.504 kg)  BMI 21.92 kg/m2  SpO2 100%  Physical Exam  Constitutional: He is oriented to person, place, and time. He appears well-developed and well-nourished. No distress.  HENT:  Head: Normocephalic and atraumatic.  Eyes: Conjunctivae and EOM are normal.  Neck: Neck supple. No tracheal deviation present.  Cardiovascular: Normal rate.   Radial pulses are symmetrical  Pulmonary/Chest: Effort normal. No respiratory distress.  Musculoskeletal: Normal range of motion.  No tenderness to the left shoulder, no decreased ROM. Pt has physiologic grip strength  Neurological: He is alert and oriented to person, place, and time.  Skin: Skin is warm and dry.  No rash or swelling noted to the left shoulder  Psychiatric: He has a normal mood and affect. His behavior is normal.  Nursing note and vitals reviewed.  ED Course  Procedures (including critical care time)  DIAGNOSTIC STUDIES: Oxygen Saturation is 100% on RA, normal by my interpretation.    COORDINATION OF CARE: 9:31 AM- Will prescribe pt with short course of Mobic. Pt advised of plan for treatment and pt agrees.     Labs Review Labs Reviewed - No data to display  Imaging Review No results found. I have personally reviewed and evaluated these images and lab results as part of my medical decision-making.   EKG Interpretation None      MDM   Final diagnoses:  Left  shoulder pain    Patient has shoulder pain with no etiology. No red flag symptoms of weakness, fever, CP, SOB, lower extremity swelling, decreased pulses neck swelling or tenderness.   Rx: Mobic and recommend a massage.  Medications  cyclobenzaprine (FLEXERIL) tablet 10 mg (10 mg Oral Not Given 05/14/15 0843)  ibuprofen (ADVIL,MOTRIN) tablet 800 mg (800 mg Oral Given 05/14/15 0844)    19 y.o.[name]'s medical screening exam was performed and I feel the patient has had an appropriate workup for their chief complaint at this time and likelihood of emergent condition existing is low. They have been counseled on decision, discharge, follow up and which symptoms necessitate immediate return to the emergency department. They or their family verbally stated understanding and agreement with plan and discharged in stable condition.   Vital signs are stable at discharge. Filed Vitals:   05/14/15 0934  BP: 137/87  Pulse: 72  Temp: 98.1 F (36.7 C)  Resp: 12    I personally performed the services described in this documentation, which was scribed in my presence. The recorded information has been reviewed and is accurate.     Marlon Pel, PA-C 05/14/15 [id number]  Courteney Randall An, MD 05/14/15 5638

## 2015-08-19 ENCOUNTER — Ambulatory Visit (INDEPENDENT_AMBULATORY_CARE_PROVIDER_SITE_OTHER): Payer: 59 | Admitting: Family Medicine

## 2015-08-19 VITALS — BP 128/60 | HR 61 | Temp 98.6°F | Resp 16 | Ht 65.5 in | Wt 145.2 lb

## 2015-08-19 DIAGNOSIS — Z13220 Encounter for screening for lipoid disorders: Secondary | ICD-10-CM

## 2015-08-19 DIAGNOSIS — Z131 Encounter for screening for diabetes mellitus: Secondary | ICD-10-CM | POA: Diagnosis not present

## 2015-08-19 DIAGNOSIS — Z0000 Encounter for general adult medical examination without abnormal findings: Secondary | ICD-10-CM | POA: Diagnosis not present

## 2015-08-19 DIAGNOSIS — Z23 Encounter for immunization: Secondary | ICD-10-CM

## 2015-08-19 DIAGNOSIS — Z7251 High risk heterosexual behavior: Secondary | ICD-10-CM

## 2015-08-19 DIAGNOSIS — R253 Fasciculation: Secondary | ICD-10-CM

## 2015-08-19 DIAGNOSIS — R258 Other abnormal involuntary movements: Secondary | ICD-10-CM

## 2015-08-19 DIAGNOSIS — R202 Paresthesia of skin: Secondary | ICD-10-CM

## 2015-08-19 LAB — POC MICROSCOPIC URINALYSIS (UMFC): Mucus: ABSENT

## 2015-08-19 LAB — POCT URINALYSIS DIP (MANUAL ENTRY)
BILIRUBIN UA: NEGATIVE
Bilirubin, UA: NEGATIVE
Glucose, UA: NEGATIVE
Leukocytes, UA: NEGATIVE
Nitrite, UA: NEGATIVE
PH UA: 6
PROTEIN UA: NEGATIVE
SPEC GRAV UA: 1.025
UROBILINOGEN UA: 0.2

## 2015-08-19 NOTE — Patient Instructions (Signed)

## 2015-08-19 NOTE — Progress Notes (Signed)
Subjective:    Patient ID: Kevin Anderson, male    DOB: [birth year], 20 y.o.   MRN: [id number]  08/19/2015  Annual Exam   HPI This 20 y.o. male presents for Complete Physical Examination.   Last physical: unsure TDAP:  Not sure Influenza:  No/refuses Eye exam:  Never; coverage with new insurance Dental exam:  Needs to schedule.  Review of Systems  Constitutional: Negative for fever, chills, diaphoresis, activity change, appetite change, fatigue and unexpected weight change.  HENT: Negative for congestion, dental problem, drooling, ear discharge, ear pain, facial swelling, hearing loss, mouth sores, nosebleeds, postnasal drip, rhinorrhea, sinus pressure, sneezing, sore throat, tinnitus, trouble swallowing and voice change.   Eyes: Negative for photophobia, pain, discharge, redness, itching and visual disturbance.  Respiratory: Negative for apnea, cough, choking, chest tightness, shortness of breath, wheezing and stridor.   Cardiovascular: Negative for chest pain, palpitations and leg swelling.  Gastrointestinal: Negative for nausea, vomiting, abdominal pain, diarrhea, constipation and blood in stool.  Endocrine: Negative for cold intolerance, heat intolerance, polydipsia, polyphagia and polyuria.  Genitourinary: Negative for dysuria, urgency, frequency, hematuria, flank pain, decreased urine volume, discharge, penile swelling, scrotal swelling, enuresis, difficulty urinating, genital sores, penile pain and testicular pain.  Musculoskeletal: Negative for myalgias, back pain, joint swelling, arthralgias, gait problem, neck pain and neck stiffness.  Skin: Negative for color change, pallor, rash and wound.  Allergic/Immunologic: Negative for environmental allergies, food allergies and immunocompromised state.  Neurological: Negative for dizziness, tremors, seizures, syncope, facial asymmetry, speech difficulty, weakness, light-headedness, numbness and headaches.  Hematological: Negative  for adenopathy. Does not bruise/bleed easily.  Psychiatric/Behavioral: Negative for suicidal ideas, hallucinations, behavioral problems, confusion, sleep disturbance, self-injury, dysphoric mood, decreased concentration and agitation. The patient is not nervous/anxious and is not hyperactive.     Past Medical History  Diagnosis Date  . Flatulence   . Lactose malabsorption   . Wrist fracture, right    Past Surgical History  Procedure Laterality Date  . Appendectomy    . Colonoscopy     No Known Allergies No current outpatient prescriptions on file.   No current facility-administered medications for this visit.   Social History   Social History  . Marital Status: Single    Spouse Name: N/A  . Number of Children: N/A  . Years of Education: N/A   Occupational History  . Not on file.   Social History Main Topics  . Smoking status: Never Smoker   . Smokeless tobacco: Not on file  . Alcohol Use: No  . Drug Use: Yes    Special: Marijuana  . Sexual Activity: Not on file   Other Topics Concern  . Not on file   Social History Narrative   Family History  Problem Relation Age of Onset  . Diabetes Other   . Heart disease Father        Objective:    BP 128/60 mmHg  Pulse 61  Temp(Src) 98.6 F (37 C) (Oral)  Resp 16  Ht 5' 5.5" (1.664 m)  Wt 145 lb 3.2 oz (65.862 kg)  BMI 23.79 kg/m2  SpO2 98% Physical Exam  Constitutional: He is oriented to person, place, and time. He appears well-developed and well-nourished. No distress.  HENT:  Head: Normocephalic and atraumatic.  Right Ear: External ear normal.  Left Ear: External ear normal.  Nose: Nose normal.  Mouth/Throat: Oropharynx is clear and moist.  Eyes: Conjunctivae and EOM are normal. Pupils are equal, round, and reactive to light.  Neck: Normal range  of motion. Neck supple. Carotid bruit is not present. No thyromegaly present.  Cardiovascular: Normal rate, regular rhythm, normal heart sounds and intact distal  pulses.  Exam reveals no gallop and no friction rub.   No murmur heard. Pulmonary/Chest: Effort normal and breath sounds normal. He has no wheezes. He has no rales.  Abdominal: Soft. Bowel sounds are normal. He exhibits no distension and no mass. There is no tenderness. There is no rebound and no guarding.  Musculoskeletal:       Right shoulder: Normal.       Left shoulder: Normal.       Cervical back: Normal.  Lymphadenopathy:    He has no cervical adenopathy.  Neurological: He is alert and oriented to person, place, and time. He has normal reflexes. No cranial nerve deficit. He exhibits normal muscle tone. Coordination normal.  Skin: Skin is warm and dry. No rash noted. He is not diaphoretic.  Psychiatric: He has a normal mood and affect. His behavior is normal. Judgment and thought content normal.   Results for orders placed or performed during the hospital encounter of 10/31/12  CBC with Differential  Result Value Ref Range   WBC 7.3 4.0 - 10.5 K/uL   RBC 5.35 4.22 - 5.81 MIL/uL   Hemoglobin 15.9 13.0 - 17.0 g/dL   HCT 16.1 09.6 - 04.5 %   MCV 79.6 78.0 - 100.0 fL   MCH 29.7 26.0 - 34.0 pg   MCHC 37.3 (H) 30.0 - 36.0 g/dL   RDW 40.9 81.1 - 91.4 %   Platelets 171 150 - 400 K/uL   Neutrophils Relative % 63 43 - 77 %   Neutro Abs 4.6 1.7 - 7.7 K/uL   Lymphocytes Relative 22 12 - 46 %   Lymphs Abs 1.6 0.7 - 4.0 K/uL   Monocytes Relative 12 3 - 12 %   Monocytes Absolute 0.8 0.1 - 1.0 K/uL   Eosinophils Relative 3 0 - 5 %   Eosinophils Absolute 0.2 0.0 - 0.7 K/uL   Basophils Relative 0 0 - 1 %   Basophils Absolute 0.0 0.0 - 0.1 K/uL  Comprehensive metabolic panel  Result Value Ref Range   Sodium 133 (L) 135 - 145 mEq/L   Potassium 4.5 3.5 - 5.1 mEq/L   Chloride 101 96 - 112 mEq/L   CO2 24 19 - 32 mEq/L   Glucose, Bld 87 70 - 99 mg/dL   BUN 10 6 - 23 mg/dL   Creatinine, Ser 7.82 0.50 - 1.35 mg/dL   Calcium 9.4 8.4 - 95.6 mg/dL   Total Protein 7.6 6.0 - 8.3 g/dL   Albumin  3.8 3.5 - 5.2 g/dL   AST 26 0 - 37 U/L   ALT 42 0 - 53 U/L   Alkaline Phosphatase 135 (H) 39 - 117 U/L   Total Bilirubin 0.5 0.3 - 1.2 mg/dL   GFR calc non Af Amer >90 >90 mL/min   GFR calc Af Amer >90 >90 mL/min  Occult blood, poc device  Result Value Ref Range   Fecal Occult Bld POSITIVE (A) NEGATIVE  Occult blood, poc device  Result Value Ref Range   Fecal Occult Bld POSITIVE (A) NEGATIVE       Assessment & Plan:  No diagnosis found.  No orders of the defined types were placed in this encounter.   No orders of the defined types were placed in this encounter.    No Follow-up on file.    Alice Burnside Paulita Fujita,  M.D. [facility] [address] [place], [place]  [address] [phone] phone [phone] fax

## 2015-08-19 NOTE — Progress Notes (Deleted)
[facility] [address], [place] [address] [phone]  Date:  08/19/2015   Name:  Kevin Anderson   DOB:  Sep 06, 1995   MRN:  [id number]  PCP:  Jearld Lesch, MD   Chief Complaint  Patient presents with  . Annual Exam    History of Present Illness:  Kevin Anderson is a 20 y.o. male patient who presents to [facility]     Patient Active Problem List   Diagnosis Date Noted  . Flatulence   . Lactose malabsorption     Past Medical History  Diagnosis Date  . Flatulence   . Lactose malabsorption   . Wrist fracture, right     Past Surgical History  Procedure Laterality Date  . Appendectomy    . Colonoscopy      Social History  Substance Use Topics  . Smoking status: Never Smoker   . Smokeless tobacco: None  . Alcohol Use: No    Family History  Problem Relation Age of Onset  . Diabetes Other   . Heart disease Father     No Known Allergies  Medication list has been reviewed and updated.  No current outpatient prescriptions on file prior to visit.   No current facility-administered medications on file prior to visit.    ROS   Physical Examination: BP 128/60 mmHg  Pulse 61  Temp(Src) 98.6 F (37 C) (Oral)  Resp 16  Ht 5' 5.5" (1.664 m)  Wt 145 lb 3.2 oz (65.862 kg)  BMI 23.79 kg/m2  SpO2 98% Ideal Body Weight: Weight in (lb) to have BMI = 25: 152.2  Physical Exam   Assessment and Plan: Kevin Anderson is a 20 y.o. male who is here today  There are no diagnoses linked to this encounter.  Trena Platt, PA-C [facility] 08/19/2015 7:48 PM

## 2015-08-20 ENCOUNTER — Ambulatory Visit (INDEPENDENT_AMBULATORY_CARE_PROVIDER_SITE_OTHER): Payer: 59 | Admitting: Radiology

## 2015-08-20 DIAGNOSIS — Z23 Encounter for immunization: Secondary | ICD-10-CM

## 2015-08-20 LAB — COMPREHENSIVE METABOLIC PANEL
ALT: 30 U/L (ref 9–46)
AST: 14 U/L (ref 10–40)
Albumin: 4.6 g/dL (ref 3.6–5.1)
Alkaline Phosphatase: 110 U/L (ref 40–115)
BILIRUBIN TOTAL: 0.6 mg/dL (ref 0.2–1.2)
BUN: 17 mg/dL (ref 7–25)
CALCIUM: 9.7 mg/dL (ref 8.6–10.3)
CO2: 24 mmol/L (ref 20–31)
Chloride: 102 mmol/L (ref 98–110)
Creat: 0.89 mg/dL (ref 0.60–1.35)
GLUCOSE: 88 mg/dL (ref 65–99)
Potassium: 4.3 mmol/L (ref 3.5–5.3)
Sodium: 139 mmol/L (ref 135–146)
Total Protein: 7.6 g/dL (ref 6.1–8.1)

## 2015-08-20 LAB — LIPID PANEL
CHOL/HDL RATIO: 3.3 ratio (ref <=5.0)
CHOLESTEROL: 130 mg/dL (ref 125–200)
HDL: 40 mg/dL (ref >=40)
LDL Cholesterol: 75 mg/dL (ref <130)
Triglycerides: 73 mg/dL (ref <150)
VLDL: 15 mg/dL (ref <30)

## 2015-08-20 LAB — CBC WITH DIFFERENTIAL/PLATELET
BASOS PCT: 0 % (ref 0–1)
Basophils Absolute: 0 10*3/uL (ref 0.0–0.1)
EOS ABS: 0.5 10*3/uL (ref 0.0–0.7)
EOS PCT: 6 % — AB (ref 0–5)
HEMATOCRIT: 44.4 % (ref 39.0–52.0)
Hemoglobin: 15.8 g/dL (ref 13.0–17.0)
Lymphocytes Relative: 37 % (ref 12–46)
Lymphs Abs: 3.1 10*3/uL (ref 0.7–4.0)
MCH: 30.2 pg (ref 26.0–34.0)
MCHC: 35.6 g/dL (ref 30.0–36.0)
MCV: 84.7 fL (ref 78.0–100.0)
MONO ABS: 0.6 10*3/uL (ref 0.1–1.0)
MPV: 9.4 fL (ref 8.6–12.4)
Monocytes Relative: 7 % (ref 3–12)
Neutro Abs: 4.2 10*3/uL (ref 1.7–7.7)
Neutrophils Relative %: 50 % (ref 43–77)
Platelets: 231 10*3/uL (ref 150–400)
RBC: 5.24 MIL/uL (ref 4.22–5.81)
RDW: 12.6 % (ref 11.5–15.5)
WBC: 8.4 10*3/uL (ref 4.0–10.5)

## 2015-08-20 LAB — HIV ANTIBODY (ROUTINE TESTING W REFLEX): HIV: NONREACTIVE

## 2015-08-20 LAB — HEMOGLOBIN A1C
Hgb A1c MFr Bld: 5 % (ref <5.7)
Mean Plasma Glucose: 97 mg/dL (ref <117)

## 2015-08-20 LAB — VITAMIN B12: Vitamin B-12: 625 pg/mL (ref 211–911)

## 2015-08-20 LAB — TSH: TSH: 1.513 u[IU]/mL (ref 0.350–4.500)

## 2015-08-21 LAB — GC/CHLAMYDIA PROBE AMP
CT Probe RNA: NOT DETECTED
GC PROBE AMP APTIMA: NOT DETECTED

## 2015-08-21 LAB — VITAMIN D 25 HYDROXY (VIT D DEFICIENCY, FRACTURES): Vit D, 25-Hydroxy: 14 ng/mL — ABNORMAL LOW (ref 30–100)

## 2015-08-21 LAB — RPR

## 2015-08-31 ENCOUNTER — Telehealth: Payer: Self-pay

## 2015-08-31 NOTE — Telephone Encounter (Signed)
Patient would like to have Dr Katrinka Blazing Call him in regard to his test results his call back number is [phone] stated he wants to talk to Dr Katrinka Blazing personally.

## 2015-09-01 NOTE — Telephone Encounter (Signed)
lmom to cb. 

## 2015-09-20 ENCOUNTER — Encounter (HOSPITAL_COMMUNITY): Payer: Self-pay | Admitting: Emergency Medicine

## 2015-09-20 ENCOUNTER — Emergency Department (HOSPITAL_COMMUNITY)
Admission: EM | Admit: 2015-09-20 | Discharge: 2015-09-20 | Disposition: A | Discharge status: Home / Self Care | Payer: 59 | Attending: Emergency Medicine | Admitting: Emergency Medicine

## 2015-09-20 DIAGNOSIS — Z8781 Personal history of (healed) traumatic fracture: Secondary | ICD-10-CM | POA: Insufficient documentation

## 2015-09-20 DIAGNOSIS — R197 Diarrhea, unspecified: Secondary | ICD-10-CM | POA: Diagnosis present

## 2015-09-20 DIAGNOSIS — A084 Viral intestinal infection, unspecified: Secondary | ICD-10-CM | POA: Insufficient documentation

## 2015-09-20 DIAGNOSIS — Z9089 Acquired absence of other organs: Secondary | ICD-10-CM | POA: Diagnosis not present

## 2015-09-20 DIAGNOSIS — Z8639 Personal history of other endocrine, nutritional and metabolic disease: Secondary | ICD-10-CM | POA: Diagnosis not present

## 2015-09-20 LAB — CBC
HEMATOCRIT: 43.8 % (ref 39.0–52.0)
HEMOGLOBIN: 15.8 g/dL (ref 13.0–17.0)
MCH: 30.9 pg (ref 26.0–34.0)
MCHC: 36.1 g/dL — ABNORMAL HIGH (ref 30.0–36.0)
MCV: 85.7 fL (ref 78.0–100.0)
Platelets: 199 10*3/uL (ref 150–400)
RBC: 5.11 MIL/uL (ref 4.22–5.81)
RDW: 12 % (ref 11.5–15.5)
WBC: 12.9 10*3/uL — AB (ref 4.0–10.5)

## 2015-09-20 LAB — COMPREHENSIVE METABOLIC PANEL
ALT: 44 U/L (ref 17–63)
AST: 27 U/L (ref 15–41)
Albumin: 4.1 g/dL (ref 3.5–5.0)
Alkaline Phosphatase: 100 U/L (ref 38–126)
Anion gap: 11 (ref 5–15)
BUN: 9 mg/dL (ref 6–20)
CO2: 24 mmol/L (ref 22–32)
Calcium: 9.5 mg/dL (ref 8.9–10.3)
Chloride: 105 mmol/L (ref 101–111)
Creatinine, Ser: 0.83 mg/dL (ref 0.61–1.24)
GFR calc Af Amer: >60 mL/min (ref >60)
GFR calc non Af Amer: >60 mL/min (ref >60)
Glucose, Bld: 117 mg/dL — ABNORMAL HIGH (ref 65–99)
Potassium: 3.9 mmol/L (ref 3.5–5.1)
Sodium: 140 mmol/L (ref 135–145)
Total Bilirubin: 0.9 mg/dL (ref 0.3–1.2)
Total Protein: 7.7 g/dL (ref 6.5–8.1)

## 2015-09-20 LAB — LIPASE, BLOOD: Lipase: 30 U/L (ref 11–51)

## 2015-09-20 LAB — URINALYSIS, ROUTINE W REFLEX MICROSCOPIC
Bilirubin Urine: NEGATIVE
Glucose, UA: NEGATIVE mg/dL
Ketones, ur: NEGATIVE mg/dL
Leukocytes, UA: NEGATIVE
Nitrite: NEGATIVE
Protein, ur: NEGATIVE mg/dL
Specific Gravity, Urine: 1.022 (ref 1.005–1.030)
pH: 6 (ref 5.0–8.0)

## 2015-09-20 LAB — URINE MICROSCOPIC-ADD ON: Bacteria, UA: NONE SEEN

## 2015-09-20 MED ORDER — ONDANSETRON HCL 4 MG/2ML IJ SOLN
4.0000 mg | Freq: Once | INTRAMUSCULAR | Status: AC
  Administered 2015-09-20: 4 mg via INTRAVENOUS
  Filled 2015-09-20: qty 2

## 2015-09-20 MED ORDER — KETOROLAC TROMETHAMINE 30 MG/ML IJ SOLN
30.0000 mg | Freq: Once | INTRAMUSCULAR | Status: AC
  Administered 2015-09-20: 30 mg via INTRAVENOUS
  Filled 2015-09-20: qty 1

## 2015-09-20 MED ORDER — ONDANSETRON 4 MG PO TBDP: 4.0000 mg | ORAL_TABLET | Freq: Three times a day (TID) | ORAL | Status: DC | PRN

## 2015-09-20 MED ORDER — SODIUM CHLORIDE 0.9 % IV BOLUS (SEPSIS)
1000.0000 mL | Freq: Once | INTRAVENOUS | Status: AC
  Administered 2015-09-20: 1000 mL via INTRAVENOUS

## 2015-09-20 NOTE — ED Provider Notes (Signed)
CSN: [id number]     Arrival date & time 09/20/15  1252 History   First MD Initiated Contact with Patient 09/20/15 1427     Chief Complaint  Patient presents with  . Emesis  . Diarrhea   HPI  Kevin Anderson is a 20 year old male with PMHx of lactose malabsorption presenting with nausea, vomiting, diarrhea and abdominal pain. Onset of symptoms was upon waking this morning. He states he has had multiple episodes of vomiting and diarrhea since waking. Denies blood in vomit or stool. He endorses feeling "hot and sweaty "while he is having a bowel movement but this resolves afterwards. He also endorses generalized abdominal cramping. He has not taken any medications prior to arrival. He states he is unable to keep food or water down due to the nausea and vomiting. He reports a history of lactose intolerance and states that he ate a lot of cheese last evening. He also notes that multiple friends have "stomach flu" with similar symptoms. Denies fevers, chills, headache, dizziness, lightheadedness, syncope, URI symptoms, chest pain, SOB, palpitations, myalgias or arthralgias.   Past Medical History  Diagnosis Date  . Flatulence   . Lactose malabsorption   . Wrist fracture, right    Past Surgical History  Procedure Laterality Date  . Colonoscopy    . Appendectomy      age 62   Family History  Problem Relation Age of Onset  . Diabetes Other   . Heart disease Father     AMI  . Hyperlipidemia Mother   . Hypertension Mother    Social History  Substance Use Topics  . Smoking status: Never Smoker   . Smokeless tobacco: None  . Alcohol Use: No    Review of Systems  Constitutional: Negative for fever and chills.  HENT: Negative.   Gastrointestinal: Positive for nausea, vomiting, abdominal pain and diarrhea. Negative for blood in stool.  Musculoskeletal: Negative for myalgias and back pain.  Neurological: Negative for dizziness, syncope, weakness and headaches.  All other systems reviewed and  are negative.     Allergies  Review of patient's allergies indicates no known allergies.  Home Medications   Prior to Admission medications   Medication Sig Start Date End Date Taking? Authorizing Provider  ondansetron (ZOFRAN ODT) 4 MG disintegrating tablet Take 1 tablet (4 mg total) by mouth every 8 (eight) hours as needed for nausea or vomiting. 09/20/15   Dawnna Gritz, PA-C   BP 114/65 mmHg  Pulse 92  Temp(Src) 98 F (36.7 C) (Oral)  Resp 18  SpO2 95% Physical Exam  Constitutional: He appears well-developed and well-nourished. No distress.  HENT:  Head: Normocephalic and atraumatic.  Eyes: Conjunctivae are normal. Right eye exhibits no discharge. Left eye exhibits no discharge. No scleral icterus.  Neck: Normal range of motion.  Cardiovascular: Normal rate and regular rhythm.   Pulmonary/Chest: Effort normal. No respiratory distress.  Abdominal: Soft. Bowel sounds are normal. There is generalized tenderness. There is no rigidity, no rebound and no guarding.  Mild generalized tenderness without rebound or guarding. Normoactive bowel sounds.   Musculoskeletal: Normal range of motion.  Neurological: He is alert. Coordination normal.  Skin: Skin is warm and dry.  Psychiatric: He has a normal mood and affect. His behavior is normal.  Nursing note and vitals reviewed.   ED Course  Procedures (including critical care time) Labs Review Labs Reviewed  COMPREHENSIVE METABOLIC PANEL - Abnormal; Notable for the following:    Glucose, Bld 117 (*)  All other components within normal limits  CBC - Abnormal; Notable for the following:    WBC 12.9 (*)    MCHC 36.1 (*)    All other components within normal limits  URINALYSIS, ROUTINE W REFLEX MICROSCOPIC (NOT AT [facility]) - Abnormal; Notable for the following:    Hgb urine dipstick SMALL (*)    All other components within normal limits  URINE MICROSCOPIC-ADD ON - Abnormal; Notable for the following:    Squamous Epithelial / LPF  0-5 (*)    All other components within normal limits  LIPASE, BLOOD    Imaging Review No results found. I have personally reviewed and evaluated these images and lab results as part of my medical decision-making.   EKG Interpretation None     3:45 - Pt reports improvement in nausea and is asking for food. Will PO challenge.  5:00 - Pt tolerating PO without recurrent nausea or vomiting. Repeat abdominal exam without tenderness to palpation.   MDM   Final diagnoses:  Viral gastroenteritis   Patient presenting with abdominal pain, nausea, vomiting and diarrhea. Known sick contacts with similar presentation. Symptoms consistent with viral gastroenteritis. Fluid bolus and zofran given in ED. Vitals are stable, patient is afebrile and appears in no acute distress. Mucus membranes are moist. Abdomen is soft with mild generalized tenderness. No focal tenderness and no peritoneal signs. Discussed that there is no indication for imaging at this time. Blood work reviewed. Increase in WBC to 12.9. Remaining blood work unremarkable. Patient is tolerating PO liquids before discharge and reports improvement in symptoms. Will discharge home with supportive therapy. Strict return precautions discussed with patient at bedside and given in discharge paperwork. Patient is stable for discharge.      Alveta Heimlich, PA-C 09/20/15 1731  Bethann Berkshire, MD 09/24/15 [phone]

## 2015-09-20 NOTE — ED Notes (Signed)
Pt from home with c/o emesis and diarrhea starting this morning at 7 am.  Pt reports has been around friends with the "GI bug" that's going around.  He states it may also be because he ate a lot of cheese last night and is lactose intolerant but has never had this happen in the past.  NAD, A&O.

## 2015-09-20 NOTE — Discharge Instructions (Signed)

## 2015-11-28 ENCOUNTER — Encounter (HOSPITAL_COMMUNITY): Payer: Self-pay

## 2015-11-28 ENCOUNTER — Ambulatory Visit: Payer: 59

## 2015-11-28 ENCOUNTER — Emergency Department (HOSPITAL_COMMUNITY)
Admission: EM | Admit: 2015-11-28 | Discharge: 2015-11-28 | Disposition: A | Discharge status: Home / Self Care | Payer: 59 | Attending: Emergency Medicine | Admitting: Emergency Medicine

## 2015-11-28 DIAGNOSIS — L739 Follicular disorder, unspecified: Secondary | ICD-10-CM

## 2015-11-28 DIAGNOSIS — Z8781 Personal history of (healed) traumatic fracture: Secondary | ICD-10-CM | POA: Insufficient documentation

## 2015-11-28 DIAGNOSIS — Z8639 Personal history of other endocrine, nutritional and metabolic disease: Secondary | ICD-10-CM | POA: Diagnosis not present

## 2015-11-28 DIAGNOSIS — R21 Rash and other nonspecific skin eruption: Secondary | ICD-10-CM | POA: Diagnosis present

## 2015-11-28 MED ORDER — CEPHALEXIN 500 MG PO CAPS: 500.0000 mg | ORAL_CAPSULE | Freq: Three times a day (TID) | ORAL | Status: DC

## 2015-11-28 NOTE — ED Notes (Signed)
Patient here with small itchy rash to groin area x 1 day, no drainage, no pain

## 2015-11-28 NOTE — ED Provider Notes (Signed)
CSN: [id number]     Arrival date & time 11/28/15  1202 History   First MD Initiated Contact with Patient 11/28/15 1246     Chief Complaint  Patient presents with  . Rash     (Consider location/radiation/quality/duration/timing/severity/associated sxs/prior Treatment) HPI Kevin Anderson is a 20 y.o. male who presents to the ED with a bump to the pubic area that started one day ago. He complains of burning. He denies dysuria, discharge from his penis or any other problems. He reports that the area was more pronounced yesterday than today but he wanted it checked.   Past Medical History  Diagnosis Date  . Flatulence   . Lactose malabsorption   . Wrist fracture, right    Past Surgical History  Procedure Laterality Date  . Colonoscopy    . Appendectomy      age 20   Family History  Problem Relation Age of Onset  . Diabetes Other   . Heart disease Father     AMI  . Hyperlipidemia Mother   . Hypertension Mother    Social History  Substance Use Topics  . Smoking status: Never Smoker   . Smokeless tobacco: None  . Alcohol Use: No    Review of Systems  Negative except as stated in HPI  Allergies  Review of patient's allergies indicates no known allergies.  Home Medications   Prior to Admission medications   Medication Sig Start Date End Date Taking? Authorizing Provider  cephALEXin (KEFLEX) 500 MG capsule Take 1 capsule (500 mg total) by mouth 3 (three) times daily. 11/28/15   Hope Orlene OchM Neese, NP  ondansetron (ZOFRAN ODT) 4 MG disintegrating tablet Take 1 tablet (4 mg total) by mouth every 8 (eight) hours as needed for nausea or vomiting. 09/20/15   Stevi Barrett, PA-C   BP 121/73 mmHg  Pulse 62  Temp(Src) 98.1 F (36.7 C) (Oral)  Resp 18  Ht 5\' 7"  (1.702 m)  Wt 65.772 kg  BMI 22.71 kg/m2  SpO2 100% Physical Exam  Constitutional: He is oriented to person, place, and time. He appears well-developed and well-nourished.  HENT:  Head: Normocephalic.  Eyes: EOM are  normal.  Neck: Neck supple.  Cardiovascular: Normal rate.   Pulmonary/Chest: Effort normal.  Abdominal: Soft.    Tiny raised area midline suprapubic area consistent with folliculitis.   Musculoskeletal: Normal range of motion.  Neurological: He is alert and oriented to person, place, and time. No cranial nerve deficit.  Skin: Skin is warm and dry.  Psychiatric: He has a normal mood and affect. His behavior is normal.  Nursing note and vitals reviewed.   ED Course  Procedures (including critical care time) Labs Review Labs Reviewed - No data to display   MDM  20 y.o. male with slightly raised tender area stable for d/c without fever, red streaking and does not appear ill. Will treat with Keflex and he will apply warm wet compresses and f/u with his PCP or return here as needed.  Final diagnoses:  Folliculitis      FarmingtonHope M Neese, NP 11/28/15 1317  Nelva Nayobert Beaton, MD 11/29/15 [phone]

## 2015-11-28 NOTE — ED Notes (Signed)
Declined W/C at D/C and was escorted to lobby by RN. 

## 2016-06-16 ENCOUNTER — Emergency Department (HOSPITAL_COMMUNITY)
Admission: EM | Admit: 2016-06-16 | Discharge: 2016-06-16 | Disposition: A | Discharge status: Home / Self Care | Payer: 59 | Attending: Emergency Medicine | Admitting: Emergency Medicine

## 2016-06-16 ENCOUNTER — Encounter (HOSPITAL_COMMUNITY): Payer: Self-pay | Admitting: *Deleted

## 2016-06-16 DIAGNOSIS — H6692 Otitis media, unspecified, left ear: Secondary | ICD-10-CM | POA: Diagnosis not present

## 2016-06-16 DIAGNOSIS — J039 Acute tonsillitis, unspecified: Secondary | ICD-10-CM | POA: Diagnosis not present

## 2016-06-16 DIAGNOSIS — H9202 Otalgia, left ear: Secondary | ICD-10-CM | POA: Diagnosis present

## 2016-06-16 MED ORDER — AMOXICILLIN 500 MG PO CAPS: 500.0000 mg | ORAL_CAPSULE | Freq: Three times a day (TID) | ORAL | 0 refills | Status: DC

## 2016-06-16 MED ORDER — AMOXICILLIN-POT CLAVULANATE 875-125 MG PO TABS
1.0000 | ORAL_TABLET | Freq: Once | ORAL | Status: AC
  Administered 2016-06-16: 1 via ORAL
  Filled 2016-06-16: qty 1

## 2016-06-16 NOTE — ED Provider Notes (Signed)
[facility]-EMERGENCY DEPT Provider Note   CSN: [id number] Arrival date & time: 06/16/16  0255  History   Chief Complaint Chief Complaint  Patient presents with  . Otalgia    HPI Kevin Anderson is a 20 y.o. male.  HPI   Patient presents to the ER with complaints of left ear pain and left tonsil pain for the past week, worse tonight. He denies having gone swimming or being out in the cold for a prolonged period of time. He reports feeling like something is moving in his ear and gets a sharp pain when he yawns. He is able to eat and drink without much difficulty, tolerating own saliva. He is afebrile, no CP, cough, neck pain. He is well appearing with no systemic symptoms.  Past Medical History:  Diagnosis Date  . Flatulence   . Lactose malabsorption   . Wrist fracture, right     Patient Active Problem List   Diagnosis Date Noted  . Flatulence   . Lactose malabsorption     Past Surgical History:  Procedure Laterality Date  . APPENDECTOMY     age 20  . COLONOSCOPY         Home Medications    Prior to Admission medications   Medication Sig Start Date End Date Taking? Authorizing Provider  amoxicillin (AMOXIL) 500 MG capsule Take 1 capsule (500 mg total) by mouth 3 (three) times daily. 06/16/16   Renly Roots Neva SeatGreene, PA-C  cephALEXin (KEFLEX) 500 MG capsule Take 1 capsule (500 mg total) by mouth 3 (three) times daily. 11/28/15   Hope Orlene OchM Neese, NP  ondansetron (ZOFRAN ODT) 4 MG disintegrating tablet Take 1 tablet (4 mg total) by mouth every 8 (eight) hours as needed for nausea or vomiting. 09/20/15   Alveta HeimlichStevi Barrett, PA-C    Family History Family History  Problem Relation Age of Onset  . Heart disease Father     AMI  . Hyperlipidemia Mother   . Hypertension Mother   . Diabetes Other     Social History Social History  Substance Use Topics  . Smoking status: Never Smoker  . Smokeless tobacco: Never Used  . Alcohol use No     Allergies   Patient has no known  allergies.   Review of Systems Review of Systems Review of Systems All other systems negative except as documented in the HPI. All pertinent positives and negatives as reviewed in the HPI.   Physical Exam Updated Vital Signs BP 133/90 (BP Location: Left Arm)   Pulse 95   Temp 99 F (37.2 C) (Oral)   Resp 18   Ht 5\' 7"  (1.702 m)   Wt 65.8 kg   SpO2 99%   BMI 22.71 kg/m   Physical Exam  Constitutional: He is oriented to person, place, and time. He appears well-developed and well-nourished. No distress.  HENT:  Head: Normocephalic and atraumatic.  Right Ear: Tympanic membrane, external ear and ear canal normal.  Left Ear: External ear and ear canal normal. Tympanic membrane is injected and erythematous. Tympanic membrane is not retracted.  Nose: Nose normal. No rhinorrhea. Right sinus exhibits no maxillary sinus tenderness and no frontal sinus tenderness. Left sinus exhibits no maxillary sinus tenderness and no frontal sinus tenderness.  Mouth/Throat: Uvula is midline and mucous membranes are normal. No trismus in the jaw. Normal dentition. No dental abscesses or uvula swelling. No oropharyngeal exudate, posterior oropharyngeal edema, posterior oropharyngeal erythema or tonsillar abscesses.  No submental edema, tongue not elevated, no trismus. No impending airway  obstruction; Pt able to speak full sentences, swallow intact, no drooling, stridor, or tonsillar/uvula displacement. No palatal petechia  Eyes: Conjunctivae are normal.  Neck: Trachea normal, normal range of motion and full passive range of motion without pain. Neck supple. No neck rigidity. Normal range of motion present. No Brudzinski's sign noted.  Flexion and extension of neck without pain or difficulty. Able to breath without difficulty in extension.  Cardiovascular: Normal rate and regular rhythm.   Pulmonary/Chest: Effort normal and breath sounds normal. No stridor. No respiratory distress. He has no wheezes.   Abdominal: Soft. There is no tenderness.  No obvious evidence of splenomegaly. Non ttp.   Musculoskeletal: Normal range of motion.  Lymphadenopathy:       Head (right side): No preauricular and no posterior auricular adenopathy present.       Head (left side): Tonsillar adenopathy present. No preauricular and no posterior auricular adenopathy present.    He has no cervical adenopathy.  Neurological: He is alert and oriented to person, place, and time.  Skin: Skin is warm and dry. No rash noted. He is not diaphoretic.  Psychiatric: He has a normal mood and affect.  Nursing note and vitals reviewed.    ED Treatments / Results  Labs (all labs ordered are listed, but only abnormal results are displayed) Labs Reviewed - No data to display  EKG  EKG Interpretation None       Radiology No results found.  Procedures Procedures (including critical care time)  Medications Ordered in ED Medications  amoxicillin-clavulanate (AUGMENTIN) 875-125 MG per tablet 1 tablet (not administered)     Initial Impression / Assessment and Plan / ED Course  I have reviewed the triage vital signs and the nursing notes.  Pertinent labs & imaging results that were available during my care of the patient were reviewed by me and considered in my medical decision making (see chart for details).  Clinical Course     Rx: Amoxicillin  Patient presents with otalgia and exam consistent with acute otitis media. No concern for acute mastoiditis, meningitis.  Patient discharged home with Amoxicillin.  Advised patient to follow-up with PCP.  I have also discussed reasons to return immediately to the ER.  Patient expresses understanding and agrees with plan. Pt appears safe for discharge.   Final Clinical Impressions(s) / ED Diagnoses   Final diagnoses:  Left otitis media, unspecified otitis media type  Tonsillitis    New Prescriptions New Prescriptions   AMOXICILLIN (AMOXIL) 500 MG CAPSULE    Take  1 capsule (500 mg total) by mouth 3 (three) times daily.     Marlon Peliffany Jannette Cotham, PA-C 06/16/16 [id number]    Glynn OctaveStephen Rancour, MD 06/16/16 [phone]

## 2016-06-16 NOTE — ED Triage Notes (Signed)
The pt has had an earache tonight with lt neck pain and mouth pain also

## 2017-06-19 ENCOUNTER — Emergency Department (HOSPITAL_COMMUNITY)
Admission: EM | Admit: 2017-06-19 | Discharge: 2017-06-19 | Disposition: A | Discharge status: Home / Self Care | Payer: 59 | Attending: Emergency Medicine | Admitting: Emergency Medicine

## 2017-06-19 ENCOUNTER — Encounter (HOSPITAL_COMMUNITY): Payer: Self-pay

## 2017-06-19 DIAGNOSIS — N342 Other urethritis: Secondary | ICD-10-CM | POA: Insufficient documentation

## 2017-06-19 LAB — URINALYSIS, ROUTINE W REFLEX MICROSCOPIC
Bilirubin Urine: NEGATIVE
GLUCOSE, UA: NEGATIVE mg/dL
Ketones, ur: NEGATIVE mg/dL
Nitrite: NEGATIVE
PH: 6 (ref 5.0–8.0)
Protein, ur: NEGATIVE mg/dL
SPECIFIC GRAVITY, URINE: 1.024 (ref 1.005–1.030)
Squamous Epithelial / LPF: NONE SEEN

## 2017-06-19 MED ORDER — LIDOCAINE HCL (PF) 1 % IJ SOLN
INTRAMUSCULAR | Status: AC
  Filled 2017-06-19: qty 5

## 2017-06-19 MED ORDER — LIDOCAINE HCL (PF) 1 % IJ SOLN
INTRAMUSCULAR | Status: AC
  Filled 2017-06-19: qty 30

## 2017-06-19 MED ORDER — CEFTRIAXONE SODIUM 250 MG IJ SOLR
250.0000 mg | Freq: Once | INTRAMUSCULAR | Status: AC
  Administered 2017-06-19: 250 mg via INTRAMUSCULAR
  Filled 2017-06-19: qty 250

## 2017-06-19 MED ORDER — AZITHROMYCIN 250 MG PO TABS
1000.0000 mg | ORAL_TABLET | Freq: Once | ORAL | Status: AC
  Administered 2017-06-19: 1000 mg via ORAL
  Filled 2017-06-19: qty 4

## 2017-06-19 NOTE — ED Triage Notes (Signed)
Per Pt, Pt reports white penile discharge since yesterday and burning while he urinates. Sexually active with protection.

## 2017-06-19 NOTE — ED Provider Notes (Signed)
[facility] EMERGENCY DEPARTMENT Provider Note   CSN: [id number] Arrival date & time: 06/19/17  1601     History   Chief Complaint Chief Complaint  Patient presents with  . Penile Discharge    HPI Kevin Anderson is a 21 y.o. male.  HPI Patient presents with burning urination for 1 day and white penile discharge.  States he was having sex previously and the condom broke.  Denies abdominal pain, nausea or vomiting.  No testicular pain or swelling.  No new rashes or swollen lymph nodes. Past Medical History:  Diagnosis Date  . Flatulence   . Lactose malabsorption   . Wrist fracture, right     Patient Active Problem List   Diagnosis Date Noted  . Flatulence   . Lactose malabsorption     Past Surgical History:  Procedure Laterality Date  . APPENDECTOMY     age 21  . COLONOSCOPY         Home Medications    Prior to Admission medications   Medication Sig Start Date End Date Taking? Authorizing Provider  amoxicillin (AMOXIL) 500 MG capsule Take 1 capsule (500 mg total) by mouth 3 (three) times daily. 06/16/16   Marlon PelGreene, Tiffany, PA-C  cephALEXin (KEFLEX) 500 MG capsule Take 1 capsule (500 mg total) by mouth 3 (three) times daily. 11/28/15   Janne NapoleonNeese, Hope M, NP  ondansetron (ZOFRAN ODT) 4 MG disintegrating tablet Take 1 tablet (4 mg total) by mouth every 8 (eight) hours as needed for nausea or vomiting. 09/20/15   Barrett, Rolm GalaStevi, PA-C    Family History Family History  Problem Relation Age of Onset  . Heart disease Father        AMI  . Hyperlipidemia Mother   . Hypertension Mother   . Diabetes Other     Social History Social History   Tobacco Use  . Smoking status: Never Smoker  . Smokeless tobacco: Never Used  Substance Use Topics  . Alcohol use: No  . Drug use: Yes    Types: Marijuana     Allergies   Patient has no known allergies.   Review of Systems Review of Systems  Constitutional: Negative for chills and fever.    Genitourinary: Positive for discharge and dysuria. Negative for flank pain, frequency, hematuria, scrotal swelling and testicular pain.  Skin: Negative for rash.  All other systems reviewed and are negative.    Physical Exam Updated Vital Signs BP 131/83 (BP Location: Right Arm)   Pulse 74   Temp 98.7 F (37.1 C) (Oral)   Resp 17   Ht 5\' 7"  (1.702 m)   Wt 72.1 kg (159 lb)   SpO2 100%   BMI 24.90 kg/m   Physical Exam  Constitutional: He is oriented to person, place, and time. He appears well-developed and well-nourished. No distress.  HENT:  Head: Normocephalic and atraumatic.  Eyes: EOM are normal. Pupils are equal, round, and reactive to light.  Neck: Normal range of motion. Neck supple.  Cardiovascular: Normal rate and regular rhythm.  Pulmonary/Chest: Effort normal and breath sounds normal.  Abdominal: Soft. Bowel sounds are normal. There is no tenderness. There is no rebound and no guarding.  Genitourinary:  Genitourinary Comments: Uncircumcised penis.  Small amount of whitish discharge.  No testicular swelling or tenderness.  No inguinal lymphadenopathy.  Musculoskeletal: Normal range of motion. He exhibits no edema or tenderness.  Neurological: He is alert and oriented to person, place, and time.  Skin: Skin is warm and dry.  No rash noted. No erythema.  Psychiatric: He has a normal mood and affect. His behavior is normal.  Nursing note and vitals reviewed.    ED Treatments / Results  Labs (all labs ordered are listed, but only abnormal results are displayed) Labs Reviewed  URINALYSIS, ROUTINE W REFLEX MICROSCOPIC - Abnormal; Notable for the following components:      Result Value   APPearance HAZY (*)    Hgb urine dipstick SMALL (*)    Leukocytes, UA LARGE (*)    Bacteria, UA RARE (*)    All other components within normal limits  URINE CULTURE  GC/CHLAMYDIA PROBE AMP ([place]) NOT AT [facility]    EKG  EKG Interpretation None       Radiology No  results found.  Procedures Procedures (including critical care time)  Medications Ordered in ED Medications  lidocaine (PF) (XYLOCAINE) 1 % injection (not administered)  lidocaine (PF) (XYLOCAINE) 1 % injection (not administered)  cefTRIAXone (ROCEPHIN) injection 250 mg (250 mg Intramuscular Given 06/19/17 1912)  azithromycin (ZITHROMAX) tablet 1,000 mg (1,000 mg Oral Given 06/19/17 1912)     Initial Impression / Assessment and Plan / ED Course  I have reviewed the triage vital signs and the nursing notes.  Pertinent labs & imaging results that were available during my care of the patient were reviewed by me and considered in my medical decision making (see chart for details).     Will treat for urethritis.  Advised to have all sexual partners evaluated.  Final Clinical Impressions(s) / ED Diagnoses   Final diagnoses:  Urethritis    ED Discharge Orders    None       Loren RacerYelverton, Lennex Pietila, MD 06/19/17 1932

## 2017-06-19 NOTE — ED Notes (Signed)
Patient left at this time with all belongings. 

## 2017-06-20 LAB — GC/CHLAMYDIA PROBE AMP (~~LOC~~) NOT AT ARMC
Chlamydia: NEGATIVE
Neisseria Gonorrhea: POSITIVE — AB

## 2017-06-21 LAB — URINE CULTURE

## 2017-06-22 ENCOUNTER — Encounter: Payer: Self-pay | Admitting: Family Medicine

## 2017-07-14 ENCOUNTER — Encounter (HOSPITAL_COMMUNITY): Payer: Self-pay | Admitting: Emergency Medicine

## 2017-07-14 ENCOUNTER — Ambulatory Visit (HOSPITAL_COMMUNITY)
Admission: EM | Admit: 2017-07-14 | Discharge: 2017-07-14 | Disposition: A | Discharge status: Home / Self Care | Payer: Self-pay | Attending: Internal Medicine | Admitting: Internal Medicine

## 2017-07-14 DIAGNOSIS — R143 Flatulence: Secondary | ICD-10-CM | POA: Insufficient documentation

## 2017-07-14 DIAGNOSIS — Z202 Contact with and (suspected) exposure to infections with a predominantly sexual mode of transmission: Secondary | ICD-10-CM | POA: Insufficient documentation

## 2017-07-14 DIAGNOSIS — Z9889 Other specified postprocedural states: Secondary | ICD-10-CM | POA: Insufficient documentation

## 2017-07-14 DIAGNOSIS — E739 Lactose intolerance, unspecified: Secondary | ICD-10-CM | POA: Insufficient documentation

## 2017-07-14 DIAGNOSIS — Z8249 Family history of ischemic heart disease and other diseases of the circulatory system: Secondary | ICD-10-CM | POA: Insufficient documentation

## 2017-07-14 DIAGNOSIS — R369 Urethral discharge, unspecified: Secondary | ICD-10-CM | POA: Insufficient documentation

## 2017-07-14 DIAGNOSIS — Z79899 Other long term (current) drug therapy: Secondary | ICD-10-CM | POA: Insufficient documentation

## 2017-07-14 DIAGNOSIS — Z833 Family history of diabetes mellitus: Secondary | ICD-10-CM | POA: Insufficient documentation

## 2017-07-14 DIAGNOSIS — Z113 Encounter for screening for infections with a predominantly sexual mode of transmission: Secondary | ICD-10-CM

## 2017-07-14 DIAGNOSIS — Z8781 Personal history of (healed) traumatic fracture: Secondary | ICD-10-CM | POA: Insufficient documentation

## 2017-07-14 MED ORDER — CEFTRIAXONE SODIUM 250 MG IJ SOLR
250.0000 mg | Freq: Once | INTRAMUSCULAR | Status: AC
  Administered 2017-07-14: 250 mg via INTRAMUSCULAR

## 2017-07-14 MED ORDER — AZITHROMYCIN 250 MG PO TABS
ORAL_TABLET | ORAL | Status: AC
  Filled 2017-07-14: qty 4

## 2017-07-14 MED ORDER — METRONIDAZOLE 500 MG PO TABS: 2000.0000 mg | ORAL_TABLET | Freq: Once | ORAL | 0 refills | Status: AC

## 2017-07-14 MED ORDER — AZITHROMYCIN 250 MG PO TABS
1000.0000 mg | ORAL_TABLET | Freq: Once | ORAL | Status: AC
  Administered 2017-07-14: 1000 mg via ORAL

## 2017-07-14 MED ORDER — CEFTRIAXONE SODIUM 250 MG IJ SOLR
INTRAMUSCULAR | Status: AC
  Filled 2017-07-14: qty 250

## 2017-07-14 NOTE — ED Triage Notes (Signed)
PT C/O: here for STI check.... [facility] but sts sx still persist w/penile d/c  ONSET: 10 days   TAKING MEDS: none   A&O x4... NAD... Ambulatory

## 2017-07-14 NOTE — ED Notes (Addendum)
Dirty urine specimen obtained and in lab 

## 2017-07-14 NOTE — Discharge Instructions (Signed)
You were treated empirically for gonorrhea, chlamydia, trichomonas. Azithromycin 1g by mouth and Rocephin 250mg  injection given in office today. Start flagyl as directed.  Refrain from alcohol use for the next 7 days given interaction with medications.  Cytology sent, you will be contacted with any positive results that requires further treatment. Refrain from sexual activity for the next 7 days.  If symptoms do not improve, resolve, follow-up with urology for further evaluation.  Monitor for any worsening of symptoms, fever, abdominal pain, nausea, vomiting, testicular pain, swelling, penile lesion to follow up for reevaluation.

## 2017-07-14 NOTE — ED Provider Notes (Signed)
[facility]    CSN: [id number] Arrival date & time: 07/14/17  1111     History   Chief Complaint Chief Complaint  Patient presents with  . Exposure to STD    HPI Kevin Anderson is a 21 y.o. male.   21 year old male comes in for STD recheck.  Patient states he was treated about a month ago for gonorrhea and chlamydia, after having penile discharge.  At that time, his cytology was positive for gonorrhea.  He was not tested for Trichomonas at that time.  However, patient did state that symptoms improved prior to current episode onset.  States that he had sexual intercourse once after treatment.  He does state that sexual intercourse happen after 7 days of treatment.  States current episode of penile discharge started 1 week ago.  Denies urinary symptoms such as frequency, dysuria, hematuria.  Denies penile lesion, testicular pain, swelling.  Denies fever, chills, night sweats.  States he has been trying to go to the health department for evaluation, but has not been able to be fitted in, therefore came in for evaluation.      Past Medical History:  Diagnosis Date  . Flatulence   . Lactose malabsorption   . Wrist fracture, right     Patient Active Problem List   Diagnosis Date Noted  . Flatulence   . Lactose malabsorption     Past Surgical History:  Procedure Laterality Date  . APPENDECTOMY     age 21  . COLONOSCOPY         Home Medications    Prior to Admission medications   Medication Sig Start Date End Date Taking? Authorizing Provider  amoxicillin (AMOXIL) 500 MG capsule Take 1 capsule (500 mg total) by mouth 3 (three) times daily. 06/16/16   Marlon PelGreene, Tiffany, PA-C  cephALEXin (KEFLEX) 500 MG capsule Take 1 capsule (500 mg total) by mouth 3 (three) times daily. 11/28/15   Janne NapoleonNeese, Hope M, NP  metroNIDAZOLE (FLAGYL) 500 MG tablet Take 4 tablets (2,000 mg total) by mouth once for 1 dose. 07/14/17 07/14/17  Cathie HoopsYu, Shamia Uppal V, PA-C  ondansetron (ZOFRAN ODT) 4 MG  disintegrating tablet Take 1 tablet (4 mg total) by mouth every 8 (eight) hours as needed for nausea or vomiting. 09/20/15   Barrett, Rolm GalaStevi, PA-C    Family History Family History  Problem Relation Age of Onset  . Heart disease Father        AMI  . Hyperlipidemia Mother   . Hypertension Mother   . Diabetes Other     Social History Social History   Tobacco Use  . Smoking status: Never Smoker  . Smokeless tobacco: Never Used  Substance Use Topics  . Alcohol use: No  . Drug use: Yes    Types: Marijuana     Allergies   Patient has no known allergies.   Review of Systems Review of Systems  Reason unable to perform ROS: See HPI as above.     Physical Exam Triage Vital Signs ED Triage Vitals [07/14/17 1205]  Enc Vitals Group     BP 118/65     Pulse Rate 77     Resp 18     Temp 98.3 F (36.8 C)     Temp Source Oral     SpO2 100 %     Weight      Height      Head Circumference      Peak Flow      Pain Score  Pain Loc      Pain Edu?      Excl. in GC?    No data found.  Updated Vital Signs BP 118/65 (BP Location: Left Arm)   Pulse 77   Temp 98.3 F (36.8 C) (Oral)   Resp 18   SpO2 100%   Visual Acuity Right Eye Distance:   Left Eye Distance:   Bilateral Distance:    Right Eye Near:   Left Eye Near:    Bilateral Near:     Physical Exam  Constitutional: He is oriented to person, place, and time. He appears well-developed and well-nourished. No distress.  HENT:  Head: Normocephalic and atraumatic.  Eyes: Conjunctivae are normal. Pupils are equal, round, and reactive to light.  Neurological: He is alert and oriented to person, place, and time.     UC Treatments / Results  Labs (all labs ordered are listed, but only abnormal results are displayed) Labs Reviewed  URINE CYTOLOGY ANCILLARY ONLY    EKG  EKG Interpretation None       Radiology No results found.  Procedures Procedures (including critical care time)  Medications  Ordered in UC Medications  azithromycin (ZITHROMAX) tablet 1,000 mg (1,000 mg Oral Given 07/14/17 1307)  cefTRIAXone (ROCEPHIN) injection 250 mg (250 mg Intramuscular Given 07/14/17 1307)     Initial Impression / Assessment and Plan / UC Course  I have reviewed the triage vital signs and the nursing notes.  Pertinent labs & imaging results that were available during my care of the patient were reviewed by me and considered in my medical decision making (see chart for details).    Patient was treated empirically for gonorrhea, chlamydia, trichomonas. Azithromycin and Rocephin given in office today.  Flagyl 2 g 1 dose sent to the pharmacy. Cytology sent, patient will be contacted with any positive results that require additional treatment. Patient to refrain from sexual activity for the next 7 days.  Discussed with patient to follow-up with urology if symptoms continues, does not resolve despite treatment.  Resources given.  Return precautions given.    Final Clinical Impressions(s) / UC Diagnoses   Final diagnoses:  Penile discharge    ED Discharge Orders        Ordered    metroNIDAZOLE (FLAGYL) 500 MG tablet   Once     07/14/17 1256         Belinda FisherYu, Niara Bunker V, [place] JerseyPA-C 07/14/17 1308

## 2017-07-17 LAB — URINE CYTOLOGY ANCILLARY ONLY
Chlamydia: NEGATIVE
NEISSERIA GONORRHEA: NEGATIVE
TRICH (WINDOWPATH): NEGATIVE

## 2017-12-08 ENCOUNTER — Encounter: Payer: Self-pay | Admitting: Family Medicine

## 2017-12-13 ENCOUNTER — Encounter: Payer: Self-pay | Admitting: Family Medicine

## 2018-08-07 ENCOUNTER — Emergency Department (HOSPITAL_COMMUNITY)
Admission: EM | Admit: 2018-08-07 | Discharge: 2018-08-07 | Disposition: A | Discharge status: Home / Self Care | Payer: Self-pay | Attending: Emergency Medicine | Admitting: Emergency Medicine

## 2018-08-07 ENCOUNTER — Other Ambulatory Visit: Payer: Self-pay

## 2018-08-07 ENCOUNTER — Emergency Department (HOSPITAL_COMMUNITY): Payer: Self-pay

## 2018-08-07 DIAGNOSIS — R1084 Generalized abdominal pain: Secondary | ICD-10-CM | POA: Insufficient documentation

## 2018-08-07 MED ORDER — POLYETHYLENE GLYCOL 3350 17 G PO PACK: 17.0000 g | PACK | Freq: Every day | ORAL | 0 refills | Status: DC

## 2018-08-07 NOTE — ED Provider Notes (Signed)
[facility] EMERGENCY DEPARTMENT Provider Note   CSN: [id number] Arrival date & time: 08/07/18  1640     History   Chief Complaint Chief Complaint  Patient presents with  . Abdominal Pain    HPI Kevin Anderson is a 23 y.o. male.  The history is provided by the patient. No language interpreter was used.  Abdominal Pain  Pain location:  Generalized Pain quality: aching   Pain radiates to:  Does not radiate Pain severity:  Moderate Onset quality:  Gradual Timing:  Constant Progression:  Worsening Chronicity:  New Relieved by:  Nothing Worsened by:  Nothing Ineffective treatments:  None tried Associated symptoms: constipation   Risk factors: no alcohol abuse    Pt reports he feels constipated.  Pt has had appendic removed.   Past Medical History:  Diagnosis Date  . Flatulence   . Lactose malabsorption   . Wrist fracture, right     Patient Active Problem List   Diagnosis Date Noted  . Flatulence   . Lactose malabsorption     Past Surgical History:  Procedure Laterality Date  . APPENDECTOMY     age 29  . COLONOSCOPY          Home Medications    Prior to Admission medications   Medication Sig Start Date End Date Taking? Authorizing Provider  aspirin-acetaminophen-caffeine (EXCEDRIN MIGRAINE) [phone] MG tablet Take 1 tablet by mouth every 6 (six) hours as needed for headache or migraine.   Yes [provider]  amoxicillin (AMOXIL) 500 MG capsule Take 1 capsule (500 mg total) by mouth 3 (three) times daily. Patient not taking: Reported on 08/07/2018 06/16/16   Marlon Pel, PA-C  cephALEXin (KEFLEX) 500 MG capsule Take 1 capsule (500 mg total) by mouth 3 (three) times daily. Patient not taking: Reported on 08/07/2018 11/28/15   Janne Napoleon, NP  ondansetron (ZOFRAN ODT) 4 MG disintegrating tablet Take 1 tablet (4 mg total) by mouth every 8 (eight) hours as needed for nausea or vomiting. Patient not taking: Reported on  08/07/2018 09/20/15   Barrett, Rolm Gala, PA-C    Family History Family History  Problem Relation Age of Onset  . Heart disease Father        AMI  . Hyperlipidemia Mother   . Hypertension Mother   . Diabetes Other     Social History Social History   Tobacco Use  . Smoking status: Never Smoker  . Smokeless tobacco: Never Used  Substance Use Topics  . Alcohol use: No  . Drug use: Yes    Types: Marijuana     Allergies   Patient has no known allergies.   Review of Systems Review of Systems  Gastrointestinal: Positive for abdominal pain and constipation.  All other systems reviewed and are negative.    Physical Exam Updated Vital Signs BP (!) 157/91 (BP Location: Right Arm)   Pulse 91   Temp 98.7 F (37.1 C) (Oral)   Resp 17   SpO2 100%   Physical Exam Vitals signs and nursing note reviewed.  Constitutional:      Appearance: He is well-developed.  HENT:     Head: Normocephalic and atraumatic.     Mouth/Throat:     Mouth: Mucous membranes are moist.  Eyes:     Extraocular Movements: Extraocular movements intact.     Conjunctiva/sclera: Conjunctivae normal.  Neck:     Musculoskeletal: Neck supple.  Cardiovascular:     Rate and Rhythm: Normal rate and regular rhythm.  Heart sounds: No murmur.  Pulmonary:     Effort: Pulmonary effort is normal. No respiratory distress.     Breath sounds: Normal breath sounds.  Abdominal:     General: Abdomen is flat.     Palpations: Abdomen is soft.     Tenderness: There is no abdominal tenderness.  Skin:    General: Skin is warm and dry.  Neurological:     General: No focal deficit present.     Mental Status: He is alert.      ED Treatments / Results  Labs (all labs ordered are listed, but only abnormal results are displayed) Labs Reviewed - No data to display  EKG None  Radiology No results found.  Procedures Procedures (including critical care time)  Medications Ordered in ED Medications - No data to  display   Initial Impression / Assessment and Plan / ED Course  I have reviewed the triage vital signs and the nursing notes.  Pertinent labs & imaging results that were available during my care of the patient were reviewed by me and considered in my medical decision making (see chart for details).     MDM  KUB normal.   Pt given rx for miralax  Final Clinical Impressions(s) / ED Diagnoses   Final diagnoses:  Generalized abdominal pain    ED Discharge Orders         Ordered    polyethylene glycol (MIRALAX) packet  Daily     08/07/18 2030           Osie CheeksSofia, Bowman Higbie K, PA-C 08/07/18 2034    Wynetta FinesMessick, Peter C, MD 08/08/18 1059

## 2018-08-07 NOTE — Discharge Instructions (Addendum)
Recheck in 24 hours if symptoms not improving.  Increase oral intake

## 2018-08-07 NOTE — ED Notes (Signed)
Pt transported to XR.  

## 2018-08-07 NOTE — ED Notes (Signed)
Patient verbalizes understanding of discharge instructions. Opportunity for questioning and answers were provided. Armband removed by staff, pt discharged from ED.  

## 2018-08-07 NOTE — ED Notes (Signed)
Pt. Back from X-ray

## 2018-08-07 NOTE — ED Triage Notes (Signed)
Patient to ED c/o abdominal pain x 4 days, intermittent and cramping in nature, states it feels like he needs to have a bowel movement, but then he can't, Denies N/V/D or fevers/chills. No urinary symptoms. Last BM at least 5 days ago.

## 2019-07-18 ENCOUNTER — Ambulatory Visit: Payer: HRSA Program | Attending: Internal Medicine

## 2019-07-18 ENCOUNTER — Telehealth: Payer: Self-pay | Admitting: Physician Assistant

## 2019-07-18 DIAGNOSIS — Z20822 Contact with and (suspected) exposure to covid-19: Secondary | ICD-10-CM

## 2019-07-18 DIAGNOSIS — Z20828 Contact with and (suspected) exposure to other viral communicable diseases: Secondary | ICD-10-CM | POA: Insufficient documentation

## 2019-07-18 MED ORDER — BENZONATATE 100 MG PO CAPS: 100.0000 mg | ORAL_CAPSULE | Freq: Three times a day (TID) | ORAL | 0 refills | Status: DC | PRN

## 2019-07-18 NOTE — Progress Notes (Signed)
E-Visit for Corona Virus Screening   Your current symptoms could be consistent with the coronavirus.  Many health care providers can now test patients at their office but not all are.  [place] has multiple testing sites. For information on our COVID testing locations and hours go to [email or web address]  We are enrolling you in our MyChart Home Monitoring for COVID19 . Daily you will receive a questionnaire within the MyChart website. Our COVID 19 response team will be monitoring your responses daily.  Testing Information: The COVID-19 Community Testing sites will begin testing BY APPOINTMENT ONLY.  You can schedule online at [email or web address]  If you do not have access to a smart phone or computer you may call [phone] for an appointment.  Testing Locations: Appointment schedule is 8 am to 3:30 pm at all sites  [place] indoors at [address], [place] [address] [facility]  indoors at [address]trance, [place], [place] [address] [place] indoors at [address], [place] [address]  Additional testing sites in the Community:  . For [facility] Testing sites in [place]  https://www.cvs.com/minuteclinic/covid-19-testing  . For Pop-up testing sites in [place]  https://covid19.ncdhhs.gov/about-covid-19/testing/find-my-testing-place/pop-testing-sites  . For Testing sites with regular hours https://onsms.org/[place]/  . For [place] [facility] https://[facility].com/covid-19-community-outreach-testing/  . For [facility] https://www.[place].gov[place]/human-services/health-department/coronavirus-covid-19-info/covid-19-testing  . For [place] testing in [place] https://www.[place].gov/[place]/human-services/health-department/coronavirus-covid-19-info/covid-19-testing  . For [facility] testing in [place]   https://lhi.care/covidtesting  For  more  information about community testing call [phone]   We are enrolling you in our MyChart Home Monitoring for COVID19 . Daily you will receive a questionnaire within the MyChart website. Our COVID 19 response team will be monitoring your responses daily.  Please quarantine yourself while awaiting your test results. If you develop fever/cough/breathlessness, please stay home for 10 days with improving symptoms and until you have had 24 hours of no fever (without taking a fever reducer).  You should wear a mask or cloth face covering over your nose and mouth if you must be around other people or animals, including pets (even at home). Try to stay at least 6 feet away from other people. This will protect the people around you.  Please continue good preventive care measures, including:  frequent hand-washing, avoid touching your face, cover coughs/sneezes, stay out of crowds and keep a 6 foot distance from others.  COVID-19 is a respiratory illness with symptoms that are similar to the flu. Symptoms are typically mild to moderate, but there have been cases of severe illness and death due to the virus.   The following symptoms may appear 2-14 days after exposure: . Fever . Cough . Shortness of breath or difficulty breathing . Chills . Repeated shaking with chills . Muscle pain . Headache . Sore throat . New loss of taste or smell . Fatigue . Congestion or runny nose . Nausea or vomiting . Diarrhea  Go to the nearest hospital ED for assessment if fever/cough/breathlessness are severe or illness seems like a threat to life.  It is vitally important that if you feel that you have an infection such as this virus or any other virus that you stay home and away from places where you may spread it to others.  You should avoid contact with people age 65 and older.   You can use medication such as A prescription cough medication called Tessalon Perles 100 mg. You may take 1-2 capsules every 8 hours as    needed for cough  You may also take acetaminophen (Tylenol) as needed for fever.  Reduce your risk of any infection by using the same precautions used for avoiding the common cold or flu:  Marland Kitchen Wash your hands often with soap and warm water for at least 20 seconds.  If soap and water are not readily available, use an alcohol-based hand sanitizer with at least 60% alcohol.  . If coughing or sneezing, cover your mouth and nose by coughing or sneezing into the elbow areas of your shirt or coat, into a tissue or into your sleeve (not your hands). . Avoid shaking hands with others and consider head nods or verbal greetings only. . Avoid touching your eyes, nose, or mouth with unwashed hands.  . Avoid close contact with people who are sick. . Avoid places or events with large numbers of people in one location, like concerts or sporting events. . Carefully consider travel plans you have or are making. . If you are planning any travel outside or inside the [place], visit the CDC's Travelers' Health webpage for the latest health notices. . If you have some symptoms but not all symptoms, continue to monitor at home and seek medical attention if your symptoms worsen. . If you are having a medical emergency, call 911.  HOME CARE . Only take medications as instructed by your medical team. . Drink plenty of fluids and get plenty of rest. . A steam or ultrasonic humidifier can help if you have congestion.   GET HELP RIGHT AWAY IF YOU HAVE EMERGENCY WARNING SIGNS** FOR COVID-19. If you or someone is showing any of these signs seek emergency medical care immediately. Call 911 or proceed to your closest emergency facility if: . You develop worsening high fever. . Trouble breathing . Bluish lips or face . Persistent pain or pressure in the chest . New confusion . Inability to wake or stay awake . You cough up blood. . Your symptoms become more severe  **This list is not all possible symptoms. Contact your  medical provider for any symptoms that are sever or concerning to you.  MAKE SURE YOU   Understand these instructions.  Will watch your condition.  Will get help right away if you are not doing well or get worse.  Your e-visit answers were reviewed by a board certified advanced clinical practitioner to complete your personal care plan.  Depending on the condition, your plan could have included both over the counter or prescription medications.  If there is a problem please reply once you have received a response from your provider.  Your safety is important to [place].  If you have drug allergies check your prescription carefully.    You can use MyChart to ask questions about today's visit, request a non-urgent call back, or ask for a work or school excuse for 24 hours related to this e-Visit. If it has been greater than 24 hours you will need to follow up with your provider, or enter a new e-Visit to address those concerns. You will get an e-mail in the next two days asking about your experience.  I hope that your e-visit has been valuable and will speed your recovery. Thank you for using e-visits.  Particia Nearing PA-C   Approximately 5 minutes was spent documenting and reviewing patient's chart.

## 2019-07-20 LAB — NOVEL CORONAVIRUS, NAA: SARS-CoV-2, NAA: NOT DETECTED

## 2020-01-01 ENCOUNTER — Ambulatory Visit: Payer: HRSA Program | Attending: Internal Medicine

## 2020-01-01 DIAGNOSIS — Z20822 Contact with and (suspected) exposure to covid-19: Secondary | ICD-10-CM | POA: Diagnosis present

## 2020-01-02 LAB — NOVEL CORONAVIRUS, NAA: SARS-CoV-2, NAA: NOT DETECTED

## 2020-01-02 LAB — SARS-COV-2, NAA 2 DAY TAT

## 2020-11-09 ENCOUNTER — Other Ambulatory Visit: Payer: Self-pay

## 2020-11-10 ENCOUNTER — Encounter: Payer: Self-pay | Admitting: Family Medicine

## 2020-11-10 ENCOUNTER — Other Ambulatory Visit: Payer: Self-pay

## 2020-11-10 ENCOUNTER — Ambulatory Visit: Payer: Managed Care, Other (non HMO) | Admitting: Family Medicine

## 2020-11-10 VITALS — BP 108/68 | HR 55 | Temp 98.1°F | Ht 67.0 in | Wt 162.4 lb

## 2020-11-10 DIAGNOSIS — Z23 Encounter for immunization: Secondary | ICD-10-CM

## 2020-11-10 DIAGNOSIS — N5089 Other specified disorders of the male genital organs: Secondary | ICD-10-CM

## 2020-11-10 DIAGNOSIS — K219 Gastro-esophageal reflux disease without esophagitis: Secondary | ICD-10-CM | POA: Diagnosis not present

## 2020-11-10 DIAGNOSIS — Z0000 Encounter for general adult medical examination without abnormal findings: Secondary | ICD-10-CM

## 2020-11-10 DIAGNOSIS — Z1159 Encounter for screening for other viral diseases: Secondary | ICD-10-CM

## 2020-11-10 LAB — COMPREHENSIVE METABOLIC PANEL
ALT: 27 U/L (ref 0–53)
AST: 13 U/L (ref 0–37)
Albumin: 4 g/dL (ref 3.5–5.2)
Alkaline Phosphatase: 105 U/L (ref 39–117)
BUN: 9 mg/dL (ref 6–23)
CO2: 29 mEq/L (ref 19–32)
Calcium: 9.3 mg/dL (ref 8.4–10.5)
Chloride: 101 mEq/L (ref 96–112)
Creatinine, Ser: 0.86 mg/dL (ref 0.40–1.50)
GFR: 119.12 mL/min (ref >60.00)
Glucose, Bld: 87 mg/dL (ref 70–99)
Potassium: 4.2 mEq/L (ref 3.5–5.1)
Sodium: 136 mEq/L (ref 135–145)
Total Bilirubin: 0.9 mg/dL (ref 0.2–1.2)
Total Protein: 6.7 g/dL (ref 6.0–8.3)

## 2020-11-10 LAB — CBC
HCT: 42.9 % (ref 39.0–52.0)
Hemoglobin: 14.9 g/dL (ref 13.0–17.0)
MCHC: 34.8 g/dL (ref 30.0–36.0)
MCV: 85.5 fl (ref 78.0–100.0)
Platelets: 252 10*3/uL (ref 150.0–400.0)
RBC: 5.02 Mil/uL (ref 4.22–5.81)
RDW: 13.1 % (ref 11.5–15.5)
WBC: 6 10*3/uL (ref 4.0–10.5)

## 2020-11-10 LAB — LIPID PANEL
Cholesterol: 111 mg/dL (ref 0–200)
HDL: 32 mg/dL — ABNORMAL LOW (ref >39.00)
LDL Cholesterol: 59 mg/dL (ref 0–99)
NonHDL: 78.88
Total CHOL/HDL Ratio: 3
Triglycerides: 97 mg/dL (ref 0.0–149.0)
VLDL: 19.4 mg/dL (ref 0.0–40.0)

## 2020-11-10 NOTE — Patient Instructions (Addendum)
Give [place] 2-3 business days to get the results of your labs back.   Keep the diet clean and stay active.  Consider using famotidine (Pepcid) 20 mg as needed.  Do monthly self testicular checks in the shower. You are feeling for lumps/bumps that don't belong. If you feel anything like this, let me know!  Someone will call you in the next few days regarding your [place].   Let us know if you need anything.

## 2020-11-10 NOTE — Progress Notes (Signed)
Chief Complaint  Patient presents with  . New Patient (Initial Visit)    Would like a physical today    Well Male Kevin Anderson is here for a complete physical.   His last physical was >1 year ago.  Current diet: in general, diet is healthy overall.   Current exercise: nothing lately; some walking Weight trend: Has lost around 15-20 lbs since the beginning of the year  Fatigue out of ordinary? No. Seat belt? Yes.   Loss of interested in doing things or depression in past 2 weeks? No  Health maintenance Tetanus- Yes HIV- Yes Hep C- No  Past Medical History:  Diagnosis Date  . Flatulence   . Lactose malabsorption   . Wrist fracture, right      Past Surgical History:  Procedure Laterality Date  . APPENDECTOMY     age 32  . COLONOSCOPY      Medications  Current Outpatient Medications on File Prior to Visit  Medication Sig Dispense Refill  . Multiple Vitamins-Minerals (ONE-A-DAY MENS PRO EDGE) TABS Take by mouth.     Allergies No Known Allergies  Family History Family History  Problem Relation Age of Onset  . Heart disease Father        AMI  . Hyperlipidemia Mother   . Hypertension Mother   . Diabetes Other     Review of Systems: Constitutional: no fevers or chills Eye:  no recent significant change in vision Ear/Nose/Mouth/Throat:  Ears:  no hearing loss Nose/Mouth/Throat:  no complaints of nasal congestion, no sore throat Cardiovascular:  no chest pain Respiratory:  no shortness of breath Gastrointestinal:  +reflux GU:  Male: negative for dysuria Musculoskeletal/Extremities:  no pain of the joints Integumentary (Skin/Breast):  no abnormal skin lesions reported Neurologic:  no headaches Endocrine: No unexpected weight changes Hematologic/Lymphatic:  no night sweats  Exam BP 108/68 (BP Location: Left Arm, Patient Position: Sitting, Cuff Size: Normal)   Pulse (!) 55   Temp 98.1 F (36.7 C) (Oral)   Ht 5\' 7"  (1.702 m)   Wt 162 lb 6 oz (73.7 kg)    SpO2 98%   BMI 25.43 kg/m  General:  well developed, well nourished, in no apparent distress Skin:  no significant moles, warts, or growths Head:  no masses, lesions, or tenderness Eyes:  pupils equal and round, sclera anicteric without injection Ears:  canals without lesions, TMs shiny without retraction, no obvious effusion, no erythema Nose:  nares patent, septum midline, mucosa normal Throat/Pharynx:  lips and gingiva without lesion; tongue and uvula midline; non-inflamed pharynx; no exudates or postnasal drainage Neck: neck supple without adenopathy, thyromegaly, or masses Lungs:  clear to auscultation, breath sounds equal bilaterally, no respiratory distress Cardio:  regular rate and rhythm, no bruits, no LE edema Abdomen:  abdomen soft, nontender; bowel sounds normal; no masses or organomegaly Genital (male): Uncircumcised. No ext lesion noted. No lesions/masses on L testicle. R testicle unremarkable other than a small semisolid circular lesion on near the R epididymis.  Rectal: Deferred Musculoskeletal:  symmetrical muscle groups noted without atrophy or deformity Extremities:  no clubbing, cyanosis, or edema, no deformities, no skin discoloration Neuro:  gait normal; deep tendon reflexes normal and symmetric Psych: well oriented with normal range of affect and appropriate judgment/insight  Assessment and Plan  Well adult exam - Plan: CBC, Comprehensive metabolic panel, Lipid panel  Encounter for hepatitis C screening test for low risk patient - Plan: Hepatitis C antibody  Gastroesophageal reflux disease, unspecified whether esophagitis present  Lump in the testicle - Plan: US Scrotum   Well 25 y.o. male. Counseled on diet and exercise. Self testicular exams recommended at least monthly.  Ck [place] for small lump near R epididymis, likely a cyst.  Other orders as above. Follow up in 1 year pending the above workup. Follow up in 4 mo for final HPV vaccine.  The patient voiced  understanding and agreement to the plan.  Kevin Roche Matheson, DO 11/10/20 8:29 AM

## 2020-11-11 LAB — HEPATITIS C ANTIBODY
Hepatitis C Ab: NONREACTIVE
SIGNAL TO CUT-OFF: 0.02 (ref <1.00)

## 2020-11-14 ENCOUNTER — Other Ambulatory Visit: Payer: Self-pay

## 2020-11-14 ENCOUNTER — Ambulatory Visit (HOSPITAL_BASED_OUTPATIENT_CLINIC_OR_DEPARTMENT_OTHER)
Admission: RE | Admit: 2020-11-14 | Discharge: 2020-11-14 | Disposition: A | Discharge status: Home / Self Care | Payer: Managed Care, Other (non HMO) | Source: Ambulatory Visit | Attending: Family Medicine | Admitting: Family Medicine

## 2020-11-14 DIAGNOSIS — N5089 Other specified disorders of the male genital organs: Secondary | ICD-10-CM | POA: Diagnosis not present

## 2021-02-01 ENCOUNTER — Telehealth: Payer: Self-pay | Admitting: Family Medicine

## 2021-02-01 NOTE — Telephone Encounter (Signed)
Copied [place] results/OV from last CPE. Patient notified and will pickup at the front today.

## 2021-02-01 NOTE — Telephone Encounter (Signed)
Pt states he is needing his scrotum US results and any documentation pretaining to it. Pt states it is needed for [employer]. Please call pt when available for pick up

## 2021-03-03 ENCOUNTER — Other Ambulatory Visit: Payer: Self-pay

## 2021-03-03 ENCOUNTER — Ambulatory Visit (INDEPENDENT_AMBULATORY_CARE_PROVIDER_SITE_OTHER): Payer: Managed Care, Other (non HMO)

## 2021-03-03 DIAGNOSIS — Z23 Encounter for immunization: Secondary | ICD-10-CM

## 2021-03-03 NOTE — Progress Notes (Signed)
Pt here to receive 3rd and last HPV vaccine per Dr. Carmelia Roller.  Shot given in left deltoid. Pt tolerated well.

## 2021-04-16 ENCOUNTER — Other Ambulatory Visit: Payer: Self-pay

## 2021-04-16 ENCOUNTER — Encounter: Payer: Self-pay | Admitting: Family Medicine

## 2021-04-16 ENCOUNTER — Ambulatory Visit: Payer: Managed Care, Other (non HMO) | Admitting: Family Medicine

## 2021-04-16 VITALS — BP 118/76 | HR 67 | Temp 98.3°F | Ht 67.0 in | Wt 178.1 lb

## 2021-04-16 DIAGNOSIS — S86899A Other injury of other muscle(s) and tendon(s) at lower leg level, unspecified leg, initial encounter: Secondary | ICD-10-CM | POA: Diagnosis not present

## 2021-04-16 NOTE — Patient Instructions (Addendum)
Consider cross training with cycling, elliptical machine, walking, treadmill work if no pain.  Avoid running outside for the next 1-1.5 months if you are having pain again. Steadily increase distance. Start with 1/4 or 1/2 a mile.  Ice/cold pack over area for 10-15 min twice daily.  Heat (pad or rice pillow in microwave) over affected area, 10-15 minutes twice daily.   OK to take Tylenol 1000 mg (2 extra strength tabs) or 975 mg (3 regular strength tabs) every 6 hours as needed.  Keep rolling the outer right leg 1-2 times daily for 10-15 minutes at a time.  Let us know if you need anything.

## 2021-04-16 NOTE — Progress Notes (Signed)
Musculoskeletal Exam  Patient: Kevin Anderson DOB: October 11, 1995  DOS: 04/16/2021  SUBJECTIVE:  Chief Complaint:   Chief Complaint  Patient presents with   Follow-up    Leg problem    Kevin Anderson is a 25 y.o.  male for evaluation and treatment of b/l leg pain.   Onset:  4  years  ago. No inj. Happens when he runs.  Location: shins Character:  aching and sharp  Progression of issue:  has worsened Associated symptoms: lateral legs are tight Denies redness, swelling, bruising Treatment: to date has been rest and rolling.   Neurovascular symptoms: no  Past Medical History:  Diagnosis Date   Flatulence    Lactose malabsorption    Wrist fracture, right     Objective: VITAL SIGNS: BP 118/76   Pulse 67   Temp 98.3 F (36.8 C) (Oral)   Ht 5\' 7"  (1.702 m)   Wt 178 lb 2 oz (80.8 kg)   SpO2 98%   BMI 27.90 kg/m  Constitutional: Well formed, well developed. No acute distress. Thorax & Lungs: No accessory muscle use Musculoskeletal: shins.   Tenderness to palpation: no Deformity: no Ecchymosis: no Neurologic: Normal sensory function. No focal deficits noted. DTR's equal and symmetric in LE's. No clonus. Psychiatric: Normal mood. Age appropriate judgment and insight. Alert & oriented x 3.    Assessment:  Medial tibial stress syndrome, unspecified laterality, initial encounter  Plan: Roll the lateral compartment of the RLE, heat, ice, Tylenol. Discussed cross training and avoiding pain/aggravating activities. Consider PT for R lateral compartment if needed. He will keep up appraised on MyChart.  F/u prn. The patient voiced understanding and agreement to the plan.   [place], DO 04/16/21  10:46 AM

## 2021-09-13 ENCOUNTER — Encounter (HOSPITAL_COMMUNITY): Payer: Self-pay | Admitting: Emergency Medicine

## 2021-09-13 ENCOUNTER — Emergency Department (HOSPITAL_COMMUNITY)
Admission: EM | Admit: 2021-09-13 | Discharge: 2021-09-13 | Disposition: A | Discharge status: Home / Self Care | Payer: Managed Care, Other (non HMO) | Attending: Student | Admitting: Student

## 2021-09-13 DIAGNOSIS — H9202 Otalgia, left ear: Secondary | ICD-10-CM | POA: Diagnosis present

## 2021-09-13 DIAGNOSIS — R590 Localized enlarged lymph nodes: Secondary | ICD-10-CM | POA: Diagnosis not present

## 2021-09-13 DIAGNOSIS — H6502 Acute serous otitis media, left ear: Secondary | ICD-10-CM | POA: Diagnosis not present

## 2021-09-13 DIAGNOSIS — J029 Acute pharyngitis, unspecified: Secondary | ICD-10-CM | POA: Diagnosis not present

## 2021-09-13 MED ORDER — NAPROXEN 500 MG PO TABS: 500.0000 mg | ORAL_TABLET | Freq: Two times a day (BID) | ORAL | 0 refills | Status: DC

## 2021-09-13 MED ORDER — AMOXICILLIN-POT CLAVULANATE 875-125 MG PO TABS: 1.0000 | ORAL_TABLET | Freq: Two times a day (BID) | ORAL | 0 refills | Status: DC

## 2021-09-13 NOTE — ED Provider Notes (Signed)
[facility] EMERGENCY DEPARTMENT Provider Note   CSN: [id number] Arrival date & time: 09/13/21  1356     History  Chief Complaint  Patient presents with   Otalgia    Kevin Anderson is a 26 y.o. male.  Patient presents to the emergency department today for evaluation of left ear pain and sore throat.  Symptoms started about 1 week ago.  He reports decreased and muffled hearing.  He has also had intermittent sharp pains in his left ear.  No fever, runny nose.  Throat is sore especially in the right side of the neck.  No difficulty swallowing or breathing.  No cough.  No vomiting or diarrhea.  He has been using eardrops that he had at home in his ear without improvement.      Home Medications Prior to Admission medications   Medication Sig Start Date End Date Taking? Authorizing Provider  amoxicillin-clavulanate (AUGMENTIN) 875-125 MG tablet Take 1 tablet by mouth every 12 (twelve) hours. 09/13/21  Yes Renne Crigler, PA-C  naproxen (NAPROSYN) 500 MG tablet Take 1 tablet (500 mg total) by mouth 2 (two) times daily. 09/13/21  Yes Renne Crigler, PA-C  Multiple Vitamins-Minerals (ONE-A-DAY MENS PRO EDGE) TABS Take by mouth.    [provider]      Allergies    Patient has no known allergies.    Review of Systems   Review of Systems  Physical Exam Updated Vital Signs BP (!) 141/96 (BP Location: Right Arm)    Pulse 84    Temp 98.7 F (37.1 C) (Oral)    Resp 16    SpO2 98%  Physical Exam Vitals and nursing note reviewed.  Constitutional:      Appearance: He is well-developed.  HENT:     Head: Normocephalic and atraumatic.     Jaw: No trismus.     Right Ear: Ear canal and external ear normal. Tympanic membrane is erythematous and bulging.     Left Ear: Tympanic membrane, ear canal and external ear normal.     Ears:     Comments: Consistent with left-sided media with effusion, air-fluid levels noted    Nose: Nose normal. No mucosal edema or  rhinorrhea.     Mouth/Throat:     Mouth: Mucous membranes are not dry.     Pharynx: Uvula midline. No oropharyngeal exudate, posterior oropharyngeal erythema or uvula swelling.     Tonsils: No tonsillar abscesses.  Eyes:     General:        Right eye: No discharge.        Left eye: No discharge.     Conjunctiva/sclera: Conjunctivae normal.  Cardiovascular:     Rate and Rhythm: Normal rate and regular rhythm.     Heart sounds: Normal heart sounds.  Pulmonary:     Effort: Pulmonary effort is normal. No respiratory distress.     Breath sounds: Normal breath sounds. No wheezing or rales.  Abdominal:     Palpations: Abdomen is soft.     Tenderness: There is no abdominal tenderness.  Musculoskeletal:     Cervical back: Normal range of motion and neck supple.  Lymphadenopathy:     Cervical: Cervical adenopathy present.     Comments: A few small mildly tender anterior cervical lymph nodes noted   Skin:    General: Skin is warm and dry.  Neurological:     Mental Status: He is alert.    ED Results / Procedures / Treatments   Labs (all  labs ordered are listed, but only abnormal results are displayed) Labs Reviewed - No data to display  EKG None  Radiology No results found.  Procedures Procedures    Medications Ordered in ED Medications - No data to display  ED Course/ Medical Decision Making/ A&P   Patient seen and examined. History obtained directly from patient.   Most recent vital signs reviewed and are as follows: BP (!) 141/96 (BP Location: Right Arm)    Pulse 84    Temp 98.7 F (37.1 C) (Oral)    Resp 16    SpO2 98%   Initial impression: Otitis media with effusion left ear, sore throat.   plan: Prescription for Augmentin, naproxen, Encouraged PCP follow-up 1 week if not improving.                           Medical Decision Making Risk Prescription drug management.  Patient with history and exam consistent with left-sided otitis media with effusion.   Reports decreased hearing likely related to effusion.  Also sore throat.  No tonsillar exudates.  No pharyngeal swelling.  Tolerating secretions.  No concern for deep space infection in the neck.  Suspect some reactive cervical lymph nodes.         Final Clinical Impression(s) / ED Diagnoses Final diagnoses:  Acute serous otitis media of left ear, recurrence not specified  Sore throat    Rx / DC Orders ED Discharge Orders          Ordered    amoxicillin-clavulanate (AUGMENTIN) 875-125 MG tablet  Every 12 hours        09/13/21 1809    naproxen (NAPROSYN) 500 MG tablet  2 times daily        09/13/21 1809              Renne Crigler, Cordelia Poche 09/13/21 1815    Kommor, [place], MD 09/14/21 [phone]

## 2021-09-13 NOTE — ED Triage Notes (Signed)
Patient here with complaint of left otalgia that started approximately one week ago. Afebrile in triage, patient is alert, oriented, and in no apparent distress at this time.

## 2021-09-13 NOTE — Discharge Instructions (Signed)
Please read and follow all provided instructions.  Your diagnoses today include:  1. Acute serous otitis media of left ear, recurrence not specified   2. Sore throat    Tests performed today include: Vital signs. See below for your results today.   Medications prescribed:  Augmentin - antibiotic  You have been prescribed an antibiotic medicine: take the entire course of medicine even if you are feeling better. Stopping early can cause the antibiotic not to work.  Naproxen - anti-inflammatory pain medication Do not exceed 500mg  naproxen every 12 hours, take with food  You have been prescribed an anti-inflammatory medication or NSAID. Take with food. Take smallest effective dose for the shortest duration needed for your pain. Stop taking if you experience stomach pain or vomiting.   Take any prescribed medications only as directed. Treatment for your infection is aimed at treating the symptoms. There are no medications, such as antibiotics, that will cure your infection.   Home care instructions:  You can take Tylenol and/or Ibuprofen as directed on the packaging for fever reduction and pain relief.    For cough: honey 1/2 to 1 teaspoon (you can dilute the honey in water or another fluid).  You can also use guaifenesin and dextromethorphan for cough. You can use a humidifier for chest congestion and cough.  If you don't have a humidifier, you can sit in the bathroom with the hot shower running.      For sore throat: try warm salt water gargles, cepacol lozenges, throat spray, warm tea or water with lemon/honey, popsicles or ice, or OTC cold relief medicine for throat discomfort.    For congestion: take a daily anti-histamine like Zyrtec, Claritin, and a oral decongestant, such as pseudoephedrine.  You can also use Flonase 1-2 sprays in each nostril daily.    It is important to stay hydrated: drink plenty of fluids (water, gatorade/powerade/pedialyte, juices, or teas) to keep your throat  moisturized and help further relieve irritation/discomfort.   Your illness is contagious and can be spread to others, especially during the first 3 or 4 days. It cannot be cured by antibiotics or other medicines. Take basic precautions such as washing your hands often, covering your mouth when you cough or sneeze, and avoiding public places where you could spread your illness to others.   Please continue drinking plenty of fluids.  Use over-the-counter medicines as needed as directed on packaging for symptom relief.  You may also use ibuprofen or tylenol as directed on packaging for pain or fever.  Do not take multiple medicines containing Tylenol or acetaminophen to avoid taking too much of this medication.  Follow-up instructions: Please follow-up with your primary care provider in the next 5 days for further evaluation of your symptoms if you are not feeling better.   Return instructions:  Please return to the Emergency Department if you experience worsening symptoms.  RETURN IMMEDIATELY IF you develop shortness of breath, confusion or altered mental status, a new rash, become dizzy, faint, or poorly responsive, or are unable to be cared for at home. Please return if you have persistent vomiting and cannot keep down fluids or develop a fever that is not controlled by tylenol or motrin.   Please return if you have any other emergent concerns.  Additional Information:  Your vital signs today were: BP (!) 141/96 (BP Location: Right Arm)    Pulse 84    Temp 98.7 F (37.1 C) (Oral)    Resp 16    SpO2  98%  If your blood pressure (BP) was elevated above 135/85 this visit, please have this repeated by your doctor within one month. --------------

## 2022-03-04 ENCOUNTER — Encounter: Payer: Self-pay | Admitting: Family Medicine

## 2022-03-04 ENCOUNTER — Ambulatory Visit (INDEPENDENT_AMBULATORY_CARE_PROVIDER_SITE_OTHER): Payer: Managed Care, Other (non HMO) | Admitting: Family Medicine

## 2022-03-04 VITALS — BP 130/80 | HR 93 | Temp 98.4°F | Ht 67.0 in | Wt 191.0 lb

## 2022-03-04 DIAGNOSIS — Z113 Encounter for screening for infections with a predominantly sexual mode of transmission: Secondary | ICD-10-CM

## 2022-03-04 DIAGNOSIS — E559 Vitamin D deficiency, unspecified: Secondary | ICD-10-CM

## 2022-03-04 DIAGNOSIS — Z0000 Encounter for general adult medical examination without abnormal findings: Secondary | ICD-10-CM

## 2022-03-04 DIAGNOSIS — R6882 Decreased libido: Secondary | ICD-10-CM

## 2022-03-04 DIAGNOSIS — Z114 Encounter for screening for human immunodeficiency virus [HIV]: Secondary | ICD-10-CM | POA: Diagnosis not present

## 2022-03-04 DIAGNOSIS — Z3141 Encounter for fertility testing: Secondary | ICD-10-CM

## 2022-03-04 NOTE — Progress Notes (Signed)
Chief Complaint  Patient presents with   Annual Exam    Well Male Kevin Anderson is here for a complete physical.   His last physical was >1 year ago.  Current diet: in general, diet is fair   Current exercise: none currently  Weight trend: up a few lbs Fatigue out of ordinary? No. Seat belt? Yes.   Advanced directive? No  Health maintenance Tetanus- Yes HIV- Yes Hep C- Yes  Patient is requesting to have a sperm count calculated.  He received the COVID-vaccine and is concerned about this.  He does not have any children.  Libido is not as good as it used to be.  Also interested in having testosterone checked.  He has a history of low vitamin D.  He is not on any prescription supplements at this time.  Past Medical History:  Diagnosis Date   Flatulence    Lactose malabsorption    Wrist fracture, right      Past Surgical History:  Procedure Laterality Date   APPENDECTOMY     age 19   COLONOSCOPY      Medications  Takes no meds routinely.   Allergies No Known Allergies  Family History Family History  Problem Relation Age of Onset   Heart disease Father        AMI   Hyperlipidemia Mother    Hypertension Mother    Diabetes Other     Review of Systems: Constitutional: no fevers or chills Eye:  no recent significant change in vision Ear/Nose/Mouth/Throat:  Ears:  no hearing loss Nose/Mouth/Throat:  no complaints of nasal congestion, no sore throat Cardiovascular:  no chest pain Respiratory:  no shortness of breath Gastrointestinal:  no abdominal pain, no change in bowel habits GU:  Male: negative for dysuria Musculoskeletal/Extremities:  no pain of the joints Integumentary (Skin/Breast):  no abnormal skin lesions reported Neurologic:  no headaches Endocrine: No unexpected weight changes Hematologic/Lymphatic:  no night sweats  Exam BP 130/80   Pulse 93   Temp 98.4 F (36.9 C) (Oral)   Ht 5\' 7"  (1.702 m)   Wt 191 lb (86.6 kg)   SpO2 98%   BMI 29.91  kg/m  General:  well developed, well nourished, in no apparent distress Skin:  no significant moles, warts, or growths Head:  no masses, lesions, or tenderness Eyes:  pupils equal and round, sclera anicteric without injection Ears:  canals without lesions, TMs shiny without retraction, no obvious effusion, no erythema Nose:  nares patent, septum midline, mucosa normal Throat/Pharynx:  lips and gingiva without lesion; tongue and uvula midline; non-inflamed pharynx; no exudates or postnasal drainage Neck: neck supple without adenopathy, thyromegaly, or masses Lungs:  clear to auscultation, breath sounds equal bilaterally, no respiratory distress Cardio:  regular rate and rhythm, no bruits, no LE edema Abdomen:  abdomen soft, nontender; bowel sounds normal; no masses or organomegaly Genital (male): Deferred Rectal: Deferred Musculoskeletal:  symmetrical muscle groups noted without atrophy or deformity Extremities:  no clubbing, cyanosis, or edema, no deformities, no skin discoloration Neuro:  gait normal; deep tendon reflexes normal and symmetric Psych: well oriented with normal range of affect and appropriate judgment/insight  Assessment and Plan  Well adult exam - Plan: CBC, Comprehensive metabolic panel, Lipid panel Routine screening for STI (sexually transmitted infection) - Plan: Urine cytology ancillary only([place])  Screening for HIV without presence of risk factors - Plan: HIV Antibody (routine testing w rflx)  Vitamin D deficiency - Plan: VITAMIN D 25 Hydroxy (Vit-D Deficiency,  Fractures)  Low libido - Plan: Testosterone  Encounter for fertility testing - Plan: Ambulatory referral to Urology   Well 26 y.o. male. Counseled on diet and exercise. Advanced directive form provided today.  Self testicular exams recommended at least monthly.  Other orders as above. Fertility testing: Refer to urology Low libido: Check testosterone and other labs as above Follow up in 1  year pending the above workup. The patient voiced understanding and agreement to the plan.  Jilda Roche [place], DO 03/04/22 3:06 PM

## 2022-03-04 NOTE — Patient Instructions (Signed)
Give us 2-3 business days to get the results of your labs back.   Keep the diet clean and stay active.  Do monthly self testicular checks in the shower. You are feeling for lumps/bumps that don't belong. If you feel anything like this, let me know!  Please get me a copy of your advanced directive form at your convenience.   I recommend getting the flu shot in mid October. This suggestion would change if the CDC comes out with a different recommendation.   Let us know if you need anything.  

## 2022-03-08 ENCOUNTER — Other Ambulatory Visit (INDEPENDENT_AMBULATORY_CARE_PROVIDER_SITE_OTHER): Payer: Managed Care, Other (non HMO)

## 2022-03-08 ENCOUNTER — Other Ambulatory Visit: Payer: Self-pay | Admitting: Family Medicine

## 2022-03-08 ENCOUNTER — Other Ambulatory Visit (HOSPITAL_COMMUNITY)
Admission: RE | Admit: 2022-03-08 | Discharge: 2022-03-08 | Disposition: A | Discharge status: Home / Self Care | Payer: Managed Care, Other (non HMO) | Source: Ambulatory Visit | Attending: Family Medicine | Admitting: Family Medicine

## 2022-03-08 ENCOUNTER — Encounter: Payer: Self-pay | Admitting: Family Medicine

## 2022-03-08 DIAGNOSIS — Z113 Encounter for screening for infections with a predominantly sexual mode of transmission: Secondary | ICD-10-CM

## 2022-03-08 DIAGNOSIS — E559 Vitamin D deficiency, unspecified: Secondary | ICD-10-CM

## 2022-03-08 DIAGNOSIS — R6882 Decreased libido: Secondary | ICD-10-CM

## 2022-03-08 DIAGNOSIS — Z114 Encounter for screening for human immunodeficiency virus [HIV]: Secondary | ICD-10-CM

## 2022-03-08 DIAGNOSIS — Z0000 Encounter for general adult medical examination without abnormal findings: Secondary | ICD-10-CM

## 2022-03-08 LAB — COMPREHENSIVE METABOLIC PANEL
ALT: 36 U/L (ref 0–53)
AST: 16 U/L (ref 0–37)
Albumin: 4.1 g/dL (ref 3.5–5.2)
Alkaline Phosphatase: 105 U/L (ref 39–117)
BUN: 12 mg/dL (ref 6–23)
CO2: 25 mEq/L (ref 19–32)
Calcium: 8.9 mg/dL (ref 8.4–10.5)
Chloride: 108 mEq/L (ref 96–112)
Creatinine, Ser: 0.89 mg/dL (ref 0.40–1.50)
GFR: 116.81 mL/min (ref >60.00)
Glucose, Bld: 93 mg/dL (ref 70–99)
Potassium: 4.5 mEq/L (ref 3.5–5.1)
Sodium: 136 mEq/L (ref 135–145)
Total Bilirubin: 0.5 mg/dL (ref 0.2–1.2)
Total Protein: 6.7 g/dL (ref 6.0–8.3)

## 2022-03-08 LAB — CBC
HCT: 45 % (ref 39.0–52.0)
Hemoglobin: 15.1 g/dL (ref 13.0–17.0)
MCHC: 33.6 g/dL (ref 30.0–36.0)
MCV: 86.2 fl (ref 78.0–100.0)
Platelets: 239 10*3/uL (ref 150.0–400.0)
RBC: 5.23 Mil/uL (ref 4.22–5.81)
RDW: 13.5 % (ref 11.5–15.5)
WBC: 7 10*3/uL (ref 4.0–10.5)

## 2022-03-08 LAB — LIPID PANEL
Cholesterol: 139 mg/dL (ref 0–200)
HDL: 41 mg/dL (ref >39.00)
LDL Cholesterol: 87 mg/dL (ref 0–99)
NonHDL: 97.73
Total CHOL/HDL Ratio: 3
Triglycerides: 53 mg/dL (ref 0.0–149.0)
VLDL: 10.6 mg/dL (ref 0.0–40.0)

## 2022-03-08 LAB — TESTOSTERONE: Testosterone: 425.37 ng/dL (ref 300.00–890.00)

## 2022-03-08 LAB — VITAMIN D 25 HYDROXY (VIT D DEFICIENCY, FRACTURES): VITD: 12.5 ng/mL — ABNORMAL LOW (ref 30.00–100.00)

## 2022-03-08 MED ORDER — VITAMIN D (ERGOCALCIFEROL) 1.25 MG (50000 UNIT) PO CAPS: 50000.0000 [IU] | ORAL_CAPSULE | ORAL | 0 refills | Status: AC

## 2022-03-09 LAB — URINE CYTOLOGY ANCILLARY ONLY
Chlamydia: NEGATIVE
Comment: NEGATIVE
Comment: NEGATIVE
Comment: NORMAL
Neisseria Gonorrhea: NEGATIVE
Trichomonas: NEGATIVE

## 2022-03-09 LAB — HIV ANTIBODY (ROUTINE TESTING W REFLEX): HIV 1&2 Ab, 4th Generation: NONREACTIVE

## 2022-03-25 ENCOUNTER — Encounter: Payer: Self-pay | Admitting: Family Medicine

## 2022-06-05 IMAGING — US US SCROTUM W/ DOPPLER COMPLETE
1 series · 13 of 25 positions shown · non-contrast
Comparison: None.

CLINICAL DATA: Right superior scrotal lump, chronic for 5-6 years.
History of chlamydia and appendectomy.

EXAM:
SCROTAL ULTRASOUND
DOPPLER ULTRASOUND OF THE TESTICLES
TECHNIQUE: Complete ultrasound examination of the testicles, epididymis, and
other scrotal structures was performed. Color and spectral Doppler
ultrasound were also utilized to evaluate blood flow to the
testicles.

[Series 1: us scrotum w/ doppler complete · 13 of 55 slices shown]
[im 1/55]
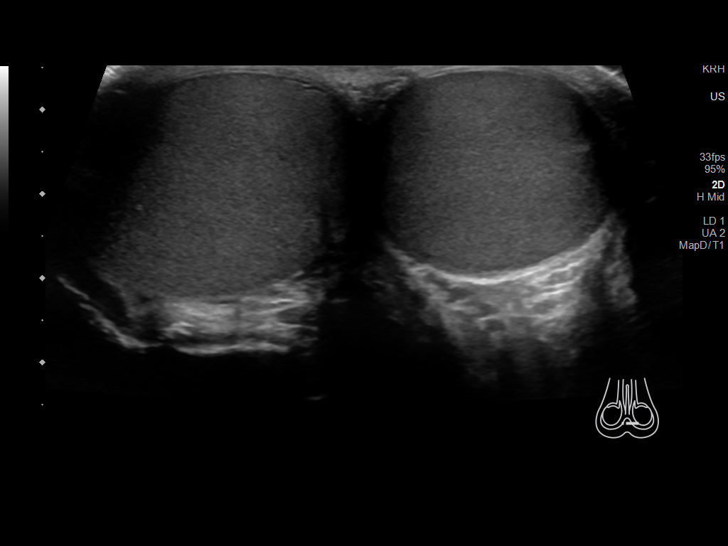
[im 5/55]
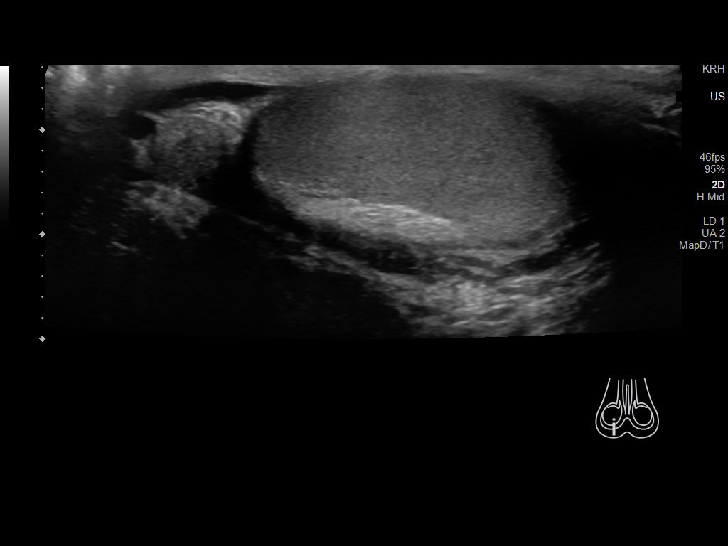
[im 10/55]
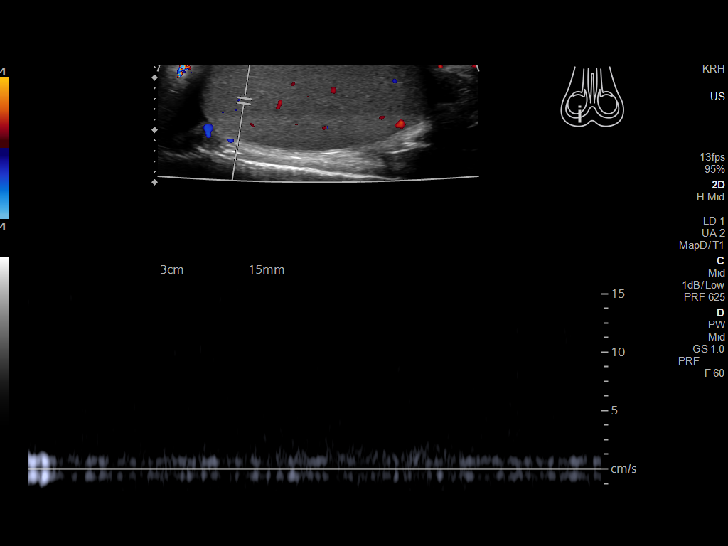
[im 14/55]
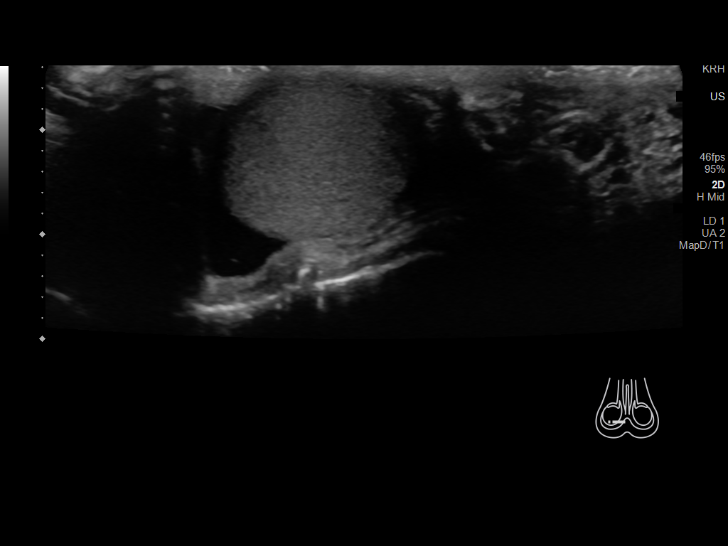
[im 19/55]
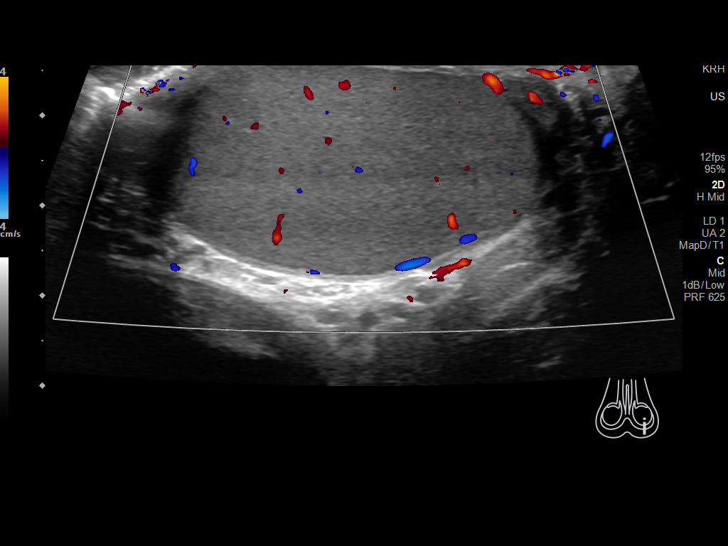
[im 23/55]
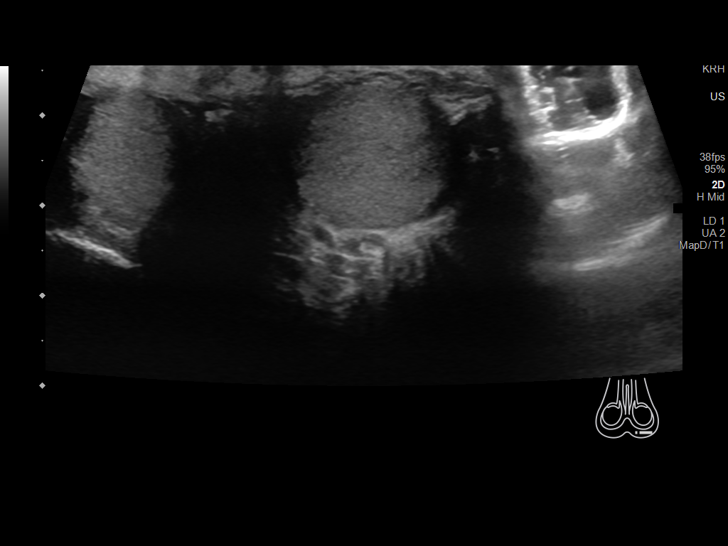
[im 28/55]
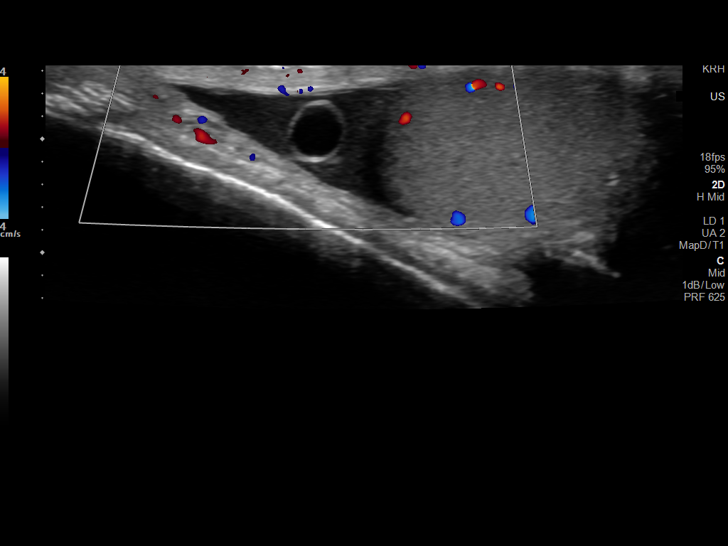
[im 32/55]
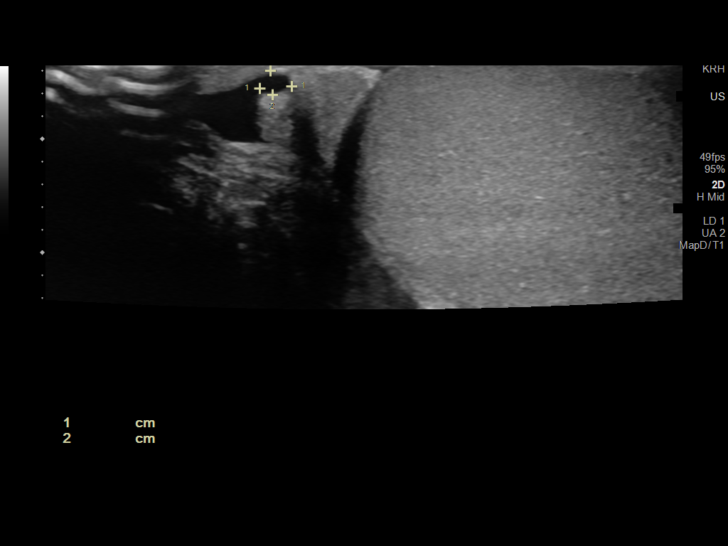
[im 37/55]
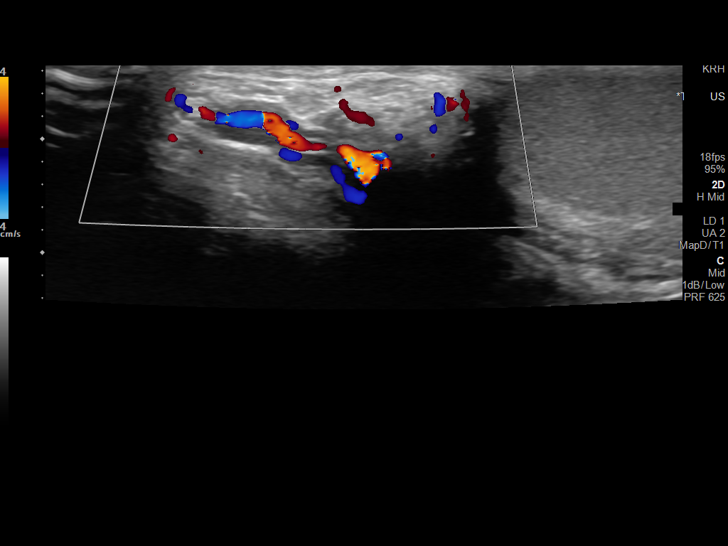
[im 41/55]
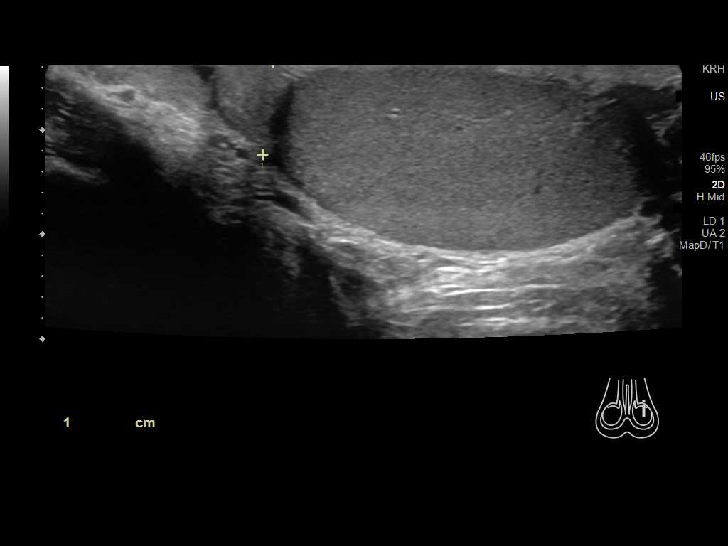
[im 46/55]
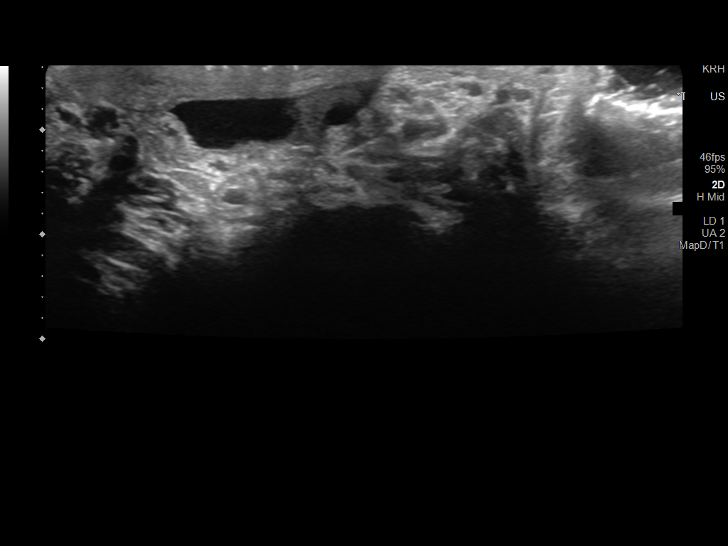
[im 50/55]
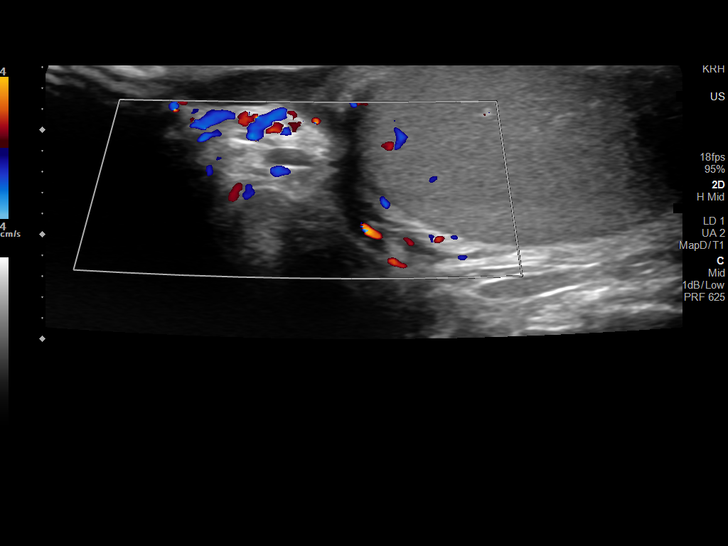
[im 55/55]
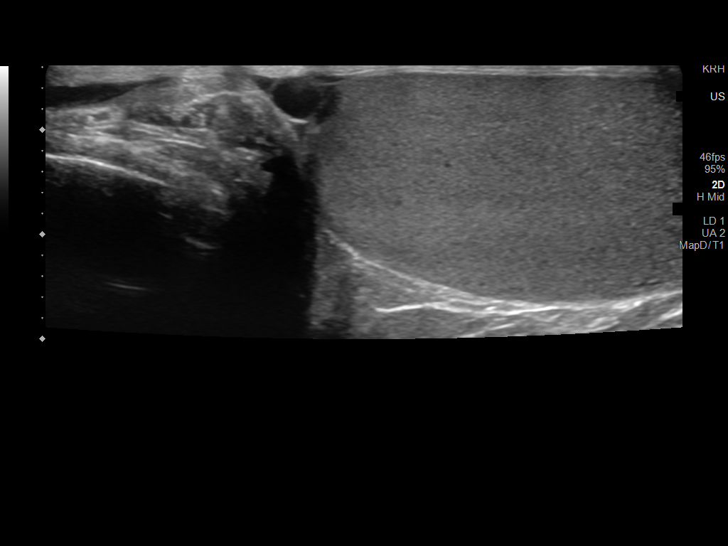

[13 of 25 positions shown; findings below may reference images not displayed]

FINDINGS: Right testicle

Measurements: 4.4 x 2.0 x 2.9 cm. No mass or microlithiasis
visualized.

Left testicle

Measurements: 4.3 x 2.4 x 2.9 cm. No mass or microlithiasis
visualized.

Right epididymis: Small simple right epididymal head cysts versus
spermatoceles measuring 6 x 6 x 6 mm (which appears exophytic and
correlates with the area of palpable concern as indicated by the
patient) and 3 x 2 x 4 mm. Otherwise normal.

Left epididymis: Simple 3 x 2 x 3 mm left epididymal cyst versus
spermatocele.

Hydrocele:  None visualized.

Varicocele:  None visualized.

Pulsed Doppler interrogation of both testes demonstrates normal low
resistance arterial and venous waveforms bilaterally.
IMPRESSION: 1. Subcentimeter simple bilateral epididymal head cysts versus
spermatoceles, the largest of which on the right measuring 6 mm
appears exophytic and correlates with the area of palpable concern
as indicated by the patient.
2. Otherwise normal scrotal sonogram.

## 2022-06-09 ENCOUNTER — Other Ambulatory Visit: Payer: Managed Care, Other (non HMO)

## 2024-05-07 ENCOUNTER — Telehealth: Payer: Self-pay

## 2024-05-07 NOTE — Telephone Encounter (Signed)
 Copied from CRM #[id number]. Topic: General - Other >> May 07, 2024  5:19 PM Alfonso HERO wrote: Reason for CRM: patient calling because he wants a rabies shot and didn't know if he needed the scheduled appt he has for Thursday or if he can just get a lab appt. Can someone please call the patient to inform him of his choices.

## 2024-05-08 NOTE — Telephone Encounter (Signed)
 Sent pt my chart message about vaccine.

## 2024-05-09 ENCOUNTER — Ambulatory Visit: Admitting: Family Medicine
